# Patient Record
Sex: Male | Born: 1950
Health system: Southern US, Community
[De-identification: ages and names within clinical notes are randomized; demographics above are authoritative.]

## PROBLEM LIST (undated history)

## (undated) DIAGNOSIS — M1990 Unspecified osteoarthritis, unspecified site: Secondary | ICD-10-CM

## (undated) DIAGNOSIS — S3981XA Other specified injuries of abdomen, initial encounter: Secondary | ICD-10-CM

## (undated) DIAGNOSIS — M069 Rheumatoid arthritis, unspecified: Secondary | ICD-10-CM

## (undated) HISTORY — PX: SHOULDER SURGERY: SHX246

## (undated) HISTORY — DX: Other specified injuries of abdomen, initial encounter: S39.81XA

## (undated) HISTORY — DX: Rheumatoid arthritis, unspecified: M06.9

---

## 2003-11-08 ENCOUNTER — Encounter (INDEPENDENT_AMBULATORY_CARE_PROVIDER_SITE_OTHER): Payer: Self-pay | Admitting: Specialist

## 2003-11-08 ENCOUNTER — Ambulatory Visit (HOSPITAL_COMMUNITY): Admission: RE | Admit: 2003-11-08 | Discharge: 2003-11-08 | Payer: Self-pay | Admitting: Gastroenterology

## 2004-08-05 DIAGNOSIS — M8660 Other chronic osteomyelitis, unspecified site: Secondary | ICD-10-CM | POA: Insufficient documentation

## 2004-09-09 ENCOUNTER — Ambulatory Visit: Payer: Self-pay | Admitting: Infectious Diseases

## 2004-10-15 ENCOUNTER — Ambulatory Visit: Payer: Self-pay | Admitting: Infectious Diseases

## 2004-11-10 ENCOUNTER — Ambulatory Visit: Payer: Self-pay | Admitting: Infectious Diseases

## 2004-12-22 ENCOUNTER — Ambulatory Visit: Payer: Self-pay | Admitting: Infectious Diseases

## 2005-03-16 ENCOUNTER — Ambulatory Visit: Payer: Self-pay | Admitting: Infectious Diseases

## 2005-05-06 ENCOUNTER — Ambulatory Visit: Payer: Self-pay | Admitting: Infectious Diseases

## 2005-06-15 ENCOUNTER — Ambulatory Visit: Payer: Self-pay | Admitting: Infectious Diseases

## 2005-08-05 ENCOUNTER — Ambulatory Visit: Payer: Self-pay | Admitting: Infectious Diseases

## 2005-11-05 ENCOUNTER — Ambulatory Visit: Payer: Self-pay | Admitting: Infectious Diseases

## 2006-02-08 ENCOUNTER — Ambulatory Visit: Payer: Self-pay | Admitting: Infectious Diseases

## 2006-05-03 ENCOUNTER — Ambulatory Visit: Payer: Self-pay | Admitting: Infectious Diseases

## 2006-09-22 ENCOUNTER — Ambulatory Visit: Payer: Self-pay | Admitting: Infectious Diseases

## 2006-10-04 ENCOUNTER — Ambulatory Visit (HOSPITAL_COMMUNITY): Admission: RE | Admit: 2006-10-04 | Discharge: 2006-10-04 | Payer: Self-pay | Admitting: Infectious Diseases

## 2007-02-08 ENCOUNTER — Encounter (INDEPENDENT_AMBULATORY_CARE_PROVIDER_SITE_OTHER): Payer: Self-pay | Admitting: Family Medicine

## 2009-05-08 ENCOUNTER — Encounter: Admission: RE | Admit: 2009-05-08 | Discharge: 2009-05-08 | Payer: Self-pay | Admitting: Orthopedic Surgery

## 2010-11-23 ENCOUNTER — Encounter: Payer: Self-pay | Admitting: Infectious Diseases

## 2011-03-20 NOTE — Op Note (Signed)
NAME:  Leroy Hines, Leroy Hines                      ACCOUNT NO.:  [id number]   MEDICAL RECORD NO.:  [id number]                   PATIENT TYPE:  AMB   LOCATION:  ENDO                                 FACILITY:  [facility]   PHYSICIAN:  Danise Edge, M.D.                DATE OF BIRTH:  04-01-1951   DATE OF PROCEDURE:  11/07/2002  DATE OF DISCHARGE:                                 OPERATIVE REPORT   PROCEDURE:  Colonoscopy with rectal polypectomy   REFERRING PHYSICIAN:  Thora Lance, M.D.   INDICATIONS:  Leroy Hines is a 60 year old male, born December 03, 1950.  Leroy Hines is scheduled to undergo his first screening colonoscopy  with polypectomy to prevent colon cancer.   ENDOSCOPIST:  Danise Edge, M.D.   PREMEDICATION:  Versed 7 mg, Demerol 50 mg.   DESCRIPTION OF PROCEDURE:  After obtaining informed consent, Leroy Hines  was placed in the left lateral decubitus position.  I administered  intravenous Demerol and intravenous Versed to achieve conscious sedation for  the procedure.  The patient's blood pressure, oxygen saturation and cardiac  rhythm were monitored throughout the procedure and documented in the medical  record.   Anal inspection was normal.  Digital rectal exam was normal.  The Olympus  adjustable pediatric colonoscope was introduced into the rectum and advanced  to the cecum.  Colonic preparation for the exam today was excellent.  Rectum:  From the mid rectum a 2 mm sessile polyp was removed with  electrocautery snare and submitted for pathology interpretation.  Sigmoid colon and descending colon:  Normal.  Splenic flexure:  Normal.  Transverse colon:  Normal.  Hepatic flexure:  Normal.  Ascending colon:  Normal.  Cecum and ileocecal valve:  Normal.   ASSESSMENT:  A small polyp was removed from the mid rectum and submitted for  pathologic interpretation; otherwise normal screening proctocolonoscopy.   RECOMMENDATIONS:  If rectal polyp returns  neoplastic pathologically, Mr.  Hines should undergo repeat colonoscopy in five years.                                               Danise Edge, M.D.    MJ/MEDQ  D:  11/08/2003  T:  11/08/2003  Job:  [id number]   cc:   Thora Lance, M.D.  [address]  [place]  [place] [address]  Fax: [phone]

## 2015-03-07 ENCOUNTER — Other Ambulatory Visit: Payer: Self-pay | Admitting: Internal Medicine

## 2015-03-07 ENCOUNTER — Ambulatory Visit
Admission: RE | Admit: 2015-03-07 | Discharge: 2015-03-07 | Disposition: A | Discharge status: Home / Self Care | Payer: BLUE CROSS/BLUE SHIELD | Source: Ambulatory Visit | Attending: Internal Medicine | Admitting: Internal Medicine

## 2015-03-07 DIAGNOSIS — R0789 Other chest pain: Secondary | ICD-10-CM

## 2015-03-26 ENCOUNTER — Other Ambulatory Visit (HOSPITAL_COMMUNITY): Payer: Self-pay | Admitting: Internal Medicine

## 2015-03-26 DIAGNOSIS — R0789 Other chest pain: Secondary | ICD-10-CM

## 2015-03-29 ENCOUNTER — Ambulatory Visit (HOSPITAL_COMMUNITY)
Admission: RE | Admit: 2015-03-29 | Discharge: 2015-03-29 | Disposition: A | Discharge status: Home / Self Care | Payer: BLUE CROSS/BLUE SHIELD | Source: Ambulatory Visit | Attending: Internal Medicine | Admitting: Internal Medicine

## 2015-03-29 DIAGNOSIS — M949 Disorder of cartilage, unspecified: Secondary | ICD-10-CM | POA: Diagnosis not present

## 2015-03-29 DIAGNOSIS — R0789 Other chest pain: Secondary | ICD-10-CM

## 2015-04-04 ENCOUNTER — Emergency Department (HOSPITAL_COMMUNITY)
Admission: EM | Admit: 2015-04-04 | Discharge: 2015-04-04 | Disposition: A | Discharge status: Home / Self Care | Payer: BLUE CROSS/BLUE SHIELD | Attending: Emergency Medicine | Admitting: Emergency Medicine

## 2015-04-04 ENCOUNTER — Encounter (HOSPITAL_COMMUNITY): Payer: Self-pay | Admitting: Emergency Medicine

## 2015-04-04 ENCOUNTER — Emergency Department (HOSPITAL_COMMUNITY): Payer: BLUE CROSS/BLUE SHIELD

## 2015-04-04 DIAGNOSIS — R531 Weakness: Secondary | ICD-10-CM | POA: Diagnosis not present

## 2015-04-04 DIAGNOSIS — Z7982 Long term (current) use of aspirin: Secondary | ICD-10-CM | POA: Insufficient documentation

## 2015-04-04 DIAGNOSIS — R0789 Other chest pain: Secondary | ICD-10-CM | POA: Insufficient documentation

## 2015-04-04 DIAGNOSIS — Z79899 Other long term (current) drug therapy: Secondary | ICD-10-CM | POA: Diagnosis not present

## 2015-04-04 DIAGNOSIS — R079 Chest pain, unspecified: Secondary | ICD-10-CM | POA: Diagnosis present

## 2015-04-04 DIAGNOSIS — M1990 Unspecified osteoarthritis, unspecified site: Secondary | ICD-10-CM | POA: Diagnosis not present

## 2015-04-04 HISTORY — DX: Unspecified osteoarthritis, unspecified site: M19.90

## 2015-04-04 LAB — CBC WITH DIFFERENTIAL/PLATELET
Basophils Absolute: 0.1 K/uL (ref 0.0–0.1)
Basophils Relative: 1 % (ref 0–1)
Eosinophils Absolute: 0.2 K/uL (ref 0.0–0.7)
Eosinophils Relative: 5 % (ref 0–5)
HCT: 44.4 % (ref 39.0–52.0)
Hemoglobin: 15.1 g/dL (ref 13.0–17.0)
Lymphocytes Relative: 27 % (ref 12–46)
Lymphs Abs: 1.3 K/uL (ref 0.7–4.0)
MCH: 31.9 pg (ref 26.0–34.0)
MCHC: 34 g/dL (ref 30.0–36.0)
MCV: 93.7 fL (ref 78.0–100.0)
Monocytes Absolute: 0.3 K/uL (ref 0.1–1.0)
Monocytes Relative: 6 % (ref 3–12)
Neutro Abs: 2.9 K/uL (ref 1.7–7.7)
Neutrophils Relative %: 61 % (ref 43–77)
Platelets: 153 K/uL (ref 150–400)
RBC: 4.74 MIL/uL (ref 4.22–5.81)
RDW: 13.1 % (ref 11.5–15.5)
WBC: 4.8 K/uL (ref 4.0–10.5)

## 2015-04-04 LAB — BASIC METABOLIC PANEL
Anion gap: 8 (ref 5–15)
BUN: 10 mg/dL (ref 6–20)
CO2: 30 mmol/L (ref 22–32)
CREATININE: 0.98 mg/dL (ref 0.61–1.24)
Calcium: 9.5 mg/dL (ref 8.9–10.3)
Chloride: 102 mmol/L (ref 101–111)
GFR calc Af Amer: >60 mL/min (ref >60)
GFR calc non Af Amer: >60 mL/min (ref >60)
Glucose, Bld: 102 mg/dL — ABNORMAL HIGH (ref 65–99)
Potassium: 4 mmol/L (ref 3.5–5.1)
SODIUM: 140 mmol/L (ref 135–145)

## 2015-04-04 LAB — TROPONIN I: Troponin I: <0.03 ng/mL

## 2015-04-04 NOTE — Discharge Instructions (Signed)

## 2015-04-04 NOTE — ED Notes (Signed)
Tightness to central chest, with dizziness and arm feeling weak.  Says it is not pain.

## 2015-04-04 NOTE — ED Provider Notes (Signed)
CSN: [id number]     Arrival date & time 04/04/15  9562 History   First MD Initiated Contact with Patient 04/04/15 1000     Chief Complaint  Patient presents with  . Chest Pain     (Consider location/radiation/quality/duration/timing/severity/associated sxs/prior Treatment) HPI   Leroy Hines is a 64 y.o. male who presents to the Emergency Department complaining of vague onset of "fluttering sensation" to his left chest and extremity weakness 3 days ago symptoms persisted the following day, he contacted his PMD and had an office visit yesterday.  Patient state he felt "fine" yesterday and had a "normal" EKG in office yesterday. Today, he states he went to play golf and felt weakness in his arms and legs again and decided to come in for evaluation.  He denies pain, shortness of breath, nausea, vomiting or back pain.  He has taken one 81 mg ASA this morning.  He denies previous cardiac history, smoking or significant family hx.     Past Medical History  Diagnosis Date  . Arthritis    Past Surgical History  Procedure Laterality Date  . Shoulder surgery      right and left.   History reviewed. No pertinent family history. History  Substance Use Topics  . Smoking status: Never Smoker   . Smokeless tobacco: Not on file  . Alcohol Use: 14.4 oz/week    24 Cans of beer per week    Review of Systems  Constitutional: Negative for fever, activity change and appetite change.  Eyes: Negative for visual disturbance.  Respiratory: Positive for chest tightness. Negative for shortness of breath and wheezing.   Gastrointestinal: Negative for nausea, vomiting and abdominal pain.  Genitourinary: Negative for dysuria and flank pain.  Musculoskeletal: Negative for arthralgias.  Skin: Negative for color change and rash.  Neurological: Positive for weakness. Negative for dizziness, seizures, syncope, speech difficulty, light-headedness, numbness and headaches.  Psychiatric/Behavioral: Negative  for confusion.  All other systems reviewed and are negative.     Allergies  Sulfamethoxazole-trimethoprim  Home Medications   Prior to Admission medications   Medication Sig Start Date End Date Taking? Authorizing Provider  aspirin EC 81 MG tablet Take 81 mg by mouth daily.   Yes Historical Provider, MD  multivitamin-iron-minerals-folic acid (CENTRUM) chewable tablet Chew 1 tablet by mouth daily.   Yes Historical Provider, MD  Omega-3 Fatty Acids (FISH OIL PO) Take 1-2 capsules by mouth 2 (two) times daily. Take 1 capsule in the morning and 2 capsules at [place]   Yes Historical Provider, MD  vitamin C (ASCORBIC ACID) 500 MG tablet Take 500 mg by mouth daily.   Yes Historical Provider, MD  Vitamin D, Cholecalciferol, 1000 UNITS TABS Take 1,000 mg by mouth daily.   Yes Historical Provider, MD  HUMIRA PEN 40 MG/0.8ML PNKT Inject 40 mg into the skin every 14 (fourteen) days.  03/29/15   Historical Provider, MD   BP 157/96 mmHg  Pulse 70  Temp(Src) 98.1 F (36.7 C) (Oral)  Resp 12  Ht 5\' 10"  (1.778 m)  Wt 200 lb (90.719 kg)  BMI 28.70 kg/m2  SpO2 100% Physical Exam  Constitutional: He is oriented to person, place, and time. He appears well-developed and well-nourished. No distress.  HENT:  Head: Normocephalic and atraumatic.  Mouth/Throat: Oropharynx is clear and moist.  Neck: Normal range of motion. Neck supple.  Cardiovascular: Normal rate, regular rhythm, normal heart sounds and intact distal pulses.   No murmur heard. Pulmonary/Chest: Effort normal and breath sounds  normal. No respiratory distress. He exhibits no tenderness.  Abdominal: Soft. He exhibits no distension. There is no tenderness. There is no rebound and no guarding.  Musculoskeletal: Normal range of motion.  Neurological: He is alert and oriented to person, place, and time. He has normal strength. No sensory deficit. Coordination normal.  Skin: Skin is warm and dry.  Psychiatric: He has a normal mood and affect.  Thought content normal.  Nursing note and vitals reviewed.   ED Course  Procedures (including critical care time) Labs Review Labs Reviewed  BASIC METABOLIC PANEL - Abnormal; Notable for the following:    Glucose, Bld 102 (*)    All other components within normal limits  CBC WITH DIFFERENTIAL/PLATELET  TROPONIN I    Imaging Review Dg Chest 2 View  04/04/2015   CLINICAL DATA:  64 year old male with a history of chest tightness  EXAM: CHEST - 2 VIEW  COMPARISON:  CT 03/29/2015.  , plain film 03/07/2015, 10/02/2015  FINDINGS: Cardiomediastinal silhouette projects within normal limits in size and contour. No confluent airspace disease, pneumothorax, or pleural effusion.  No displaced fracture.  Unremarkable appearance of the upper abdomen.  IMPRESSION: No radiographic evidence of acute cardiopulmonary disease.  Signed,  Dulcy Fanny. Earleen Newport, DO  [facility]  [facility]   Electronically Signed   By: Corrie Mckusick D.O.   On: 04/04/2015 11:13   Ct Chest Wo Contrast  03/29/2015   CLINICAL DATA:  Pain at xiphoid when bending forward  EXAM: CT CHEST WITHOUT CONTRAST  TECHNIQUE: Multidetector CT imaging of the chest was performed following the standard protocol without IV contrast.  COMPARISON:  None.  FINDINGS: Mediastinum: The heart size appears normal. There is no pericardial effusion. Aortic atherosclerosis identified. There also calcifications within the LAD and RCA Coronary artery. The trachea is patent and appears midline. Normal appearance of the esophagus.  Lungs/Pleura: There is no pleural effusion. No airspace consolidation or atelectasis. No suspicious nodule or mass noted.  Upper Abdomen: The visualized portions of the liver and spleen are normal. The adrenal glands are unremarkable. Normal appearance of the pancreas.  Musculoskeletal: Review of the visualized bony structures is negative for aggressive lytic or sclerotic bone lesion. The manubrium,  sternal body and xiphoid process appear normal. There is no fracture identified. The thoracic spine appears normal. The remainder of the bony thorax is intact. No suspicious soft tissue mass associated with the sternum or xiphoid process.  IMPRESSION: 1. No active cardiopulmonary abnormalities. 2. No explanation for patient's the xiphoid pain. 3. Aortic atherosclerosis and coronary artery calcifications.   Electronically Signed   By: Kerby Moors M.D.   On: 03/29/2015 14:35      EKG Interpretation   Date/Time:  Thursday April 04 2015 09:52:30 EDT Ventricular Rate:  72 PR Interval:  178 QRS Duration: 107 QT Interval:  397 QTC Calculation: 434 R Axis:   70 Text Interpretation:  Sinus rhythm RSR' in V1 or V2, right VCD or RVH  Confirmed by PICKERING  MD, NATHAN [phone]) on 04/04/2015 10:22:06 AM      MDM   Final diagnoses:  Chest pain, unspecified chest pain type   Labs and XR are reassuring.  Pt has been resting comfortably during ed stay.    Pt also seen by Dr. Alvino Chapel and felt patient is stable for discharge.  Will give referral for cards and pt agrees to return here for any worsening sx's.     Kem Parkinson, PA-C 04/06/15 2217  Davonna Belling, MD 04/08/15 [phone]

## 2015-04-22 ENCOUNTER — Ambulatory Visit (INDEPENDENT_AMBULATORY_CARE_PROVIDER_SITE_OTHER): Payer: BLUE CROSS/BLUE SHIELD | Admitting: Internal Medicine

## 2015-04-22 ENCOUNTER — Encounter: Payer: Self-pay | Admitting: Internal Medicine

## 2015-04-22 VITALS — BP 138/80 | HR 72 | Ht 70.0 in | Wt 202.0 lb

## 2015-04-22 DIAGNOSIS — I4901 Ventricular fibrillation: Secondary | ICD-10-CM

## 2015-04-22 DIAGNOSIS — I498 Other specified cardiac arrhythmias: Secondary | ICD-10-CM

## 2015-04-22 DIAGNOSIS — R0789 Other chest pain: Secondary | ICD-10-CM

## 2015-04-22 NOTE — Addendum Note (Signed)
Addended by: Fay Records on: 04/22/2015 10:07 PM   Modules accepted: Level of Service

## 2015-04-22 NOTE — Progress Notes (Signed)
Cardiology Office Note   Date:  04/22/2015   ID:  Leroy Hines, DOB [birth year], MRN [id number]  PCP:  Henrine Screws, MD  Cardiologist:   Dorris Carnes, MD   No chief complaint on file.  F/U from ER for chest discomfort    History of Present Illness: Leroy Hines is a 64 y.o. male who was seen in ER on 6/2  COmplained of fluttering sensation in L chest with some weakness in arm  Occurred at rest   Lasted about a day  Came back  Flet heavy in arms and legs   He was in SR at the time  Sent home with f/u in cardiology    Pt felt a fluttering then discomfort in chest  Pt had had a XT of chest done for pain in xiphoid process.  THis showed some atherosclerosis of LAD and RCA   Has seen by Dr Laurann Montana since ER visit   Since seen by Dr Laurann Montana has not had any more symptoms    Been active  Played golf  Mowed lawn.    Denies pressure  Denies fluttering    Current Outpatient Prescriptions  Medication Sig Dispense Refill  . aspirin EC 81 MG tablet Take 81 mg by mouth daily.    Marland Kitchen HUMIRA PEN 40 MG/0.8ML PNKT Inject 40 mg into the skin every 14 (fourteen) days.   4  . multivitamin-iron-minerals-folic acid (CENTRUM) chewable tablet Chew 1 tablet by mouth daily.    . Omega-3 Fatty Acids (FISH OIL PO) Take 1 capsule by mouth daily. Take 1 capsule in the morning and 2 capsules at [place]    . vitamin C (ASCORBIC ACID) 500 MG tablet Take 500 mg by mouth daily.    . Vitamin D, Cholecalciferol, 1000 UNITS TABS Take 1,000 mg by mouth daily.    Marland Kitchen VIAGRA 100 MG tablet   2   No current facility-administered medications for this visit.    Allergies:   Sulfamethoxazole-trimethoprim   Past Medical History  Diagnosis Date  . Arthritis     Past Surgical History  Procedure Laterality Date  . Shoulder surgery      right and left.     Social History:  The patient  reports that he has never smoked. He does not have any smokeless tobacco history on file. He reports that he drinks  about 14.4 oz of alcohol per week. He reports that he does not use illicit drugs.   Family History:  The patient's family history is not on file.    ROS:  Please see the history of present illness. All other systems are reviewed and  Negative to the above problem except as noted.    PHYSICAL EXAM: VS:  BP 138/80 mmHg  Pulse 72  Ht 5\' 10"  (1.778 m)  Wt 202 lb (91.627 kg)  BMI 28.98 kg/m2  SpO2 98%  GEN: Well nourished, well developed, in no acute distress HEENT: normal Neck: no JVD, carotid bruits, or masses Cardiac: RRR; no murmurs, rubs, or gallops,no edema  Chest  Tender in xiphid region   Respiratory:  clear to auscultation bilaterally, normal work of breathing GI: soft, nontender, nondistended, + BS  No hepatomegaly  MS: no deformity Moving all extremities   Skin: warm and dry, no rash Neuro:  Strength and sensation are intact Psych: euthymic mood, full affect   EKG:  EKG is not ordered today.   Lipid Panel No results found for: CHOL, TRIG, HDL, CHOLHDL, VLDL, LDLCALC, LDLDIRECT  Wt Readings from Last 3 Encounters:  04/22/15 202 lb (91.627 kg)  04/04/15 200 lb (90.719 kg)      ASSESSMENT AND PLAN: 1  Chest pressure  FLuttering   Atypical for ischemia  With fluttering ? afib but I am not convinced. He is active no  No recurrence would follow  CT of chest for xiphoid pain showed coronary calcificaitons consistent with atherosclerosis . I reivewed findings with pt  Has CAD but again I do not think flow limiting   I would recomm ASA as he is on  I would also recomm a statin  I do not have labs but have requested them  [place] with pharmacy interactions with [place]  Pt appears very reluctant to start Rx  Would like to discuss with Dr Inda Merlin as well.  2.  Fluttering  Showed him how to check carotid pulse  Call if HR high.  I would not sched for monitor   3.  Xiphoid pain  Probably musculoskel in origin  Discussed wt loss  4  HL  Pt told his lipids are  high  WIll get from Dr Inda Merlin      Signed, Dorris Carnes, MD  04/22/2015 10:06 AM    [address], [place], [place]  [address] Phone: [phone]; Fax: [phone]

## 2015-05-22 ENCOUNTER — Telehealth: Payer: Self-pay

## 2015-05-22 NOTE — Telephone Encounter (Signed)
Patient wants to see his PCP first before starting Crestor. He will get back in touch with our office when he decides if he wants to start Crestor.   Review of lipids with S Earl    Recomm strating crestor 10 mg F/u lipid panel in 8 wks with AST    ----- Message -----     From: Aris Georgia, [facility]     Sent: 05/17/2015  3:20 PM      To: Fay Records, MD        No issues of statin with Humira. LDL was 115 so could start at a lower dose. Given his hesitancy, may start with Crestor 10mg  daily to limit potential SE.         ----- Message -----     From: Fay Records, MD     Sent: 04/23/2015  4:02 PM      To: Aris Georgia, RPH        Pt's LDL is 115, HDL 53 Trig 162     Very anxious about starting statin given that he is on Humira.    Thoughts about statins, preferences    Has CAD on CT

## 2015-05-27 ENCOUNTER — Telehealth: Payer: Self-pay | Admitting: Internal Medicine

## 2015-05-27 NOTE — Telephone Encounter (Signed)
New message    Patient calling back to discuss with the  nurse on new medication crestor

## 2015-05-27 NOTE — Telephone Encounter (Signed)
Patient is still wanting to talk with his PCP before he starts Crestor. Patient wanting copies of lab work. Patient's MyChart pending, so patient given number to reset password. Patient verbalized understanding and will keep office informed of his decision.

## 2015-07-15 ENCOUNTER — Ambulatory Visit (INDEPENDENT_AMBULATORY_CARE_PROVIDER_SITE_OTHER): Payer: BLUE CROSS/BLUE SHIELD | Admitting: Physician Assistant

## 2015-07-15 ENCOUNTER — Encounter: Payer: Self-pay | Admitting: Physician Assistant

## 2015-07-15 VITALS — BP 110/70 | HR 66 | Wt 193.2 lb

## 2015-07-15 DIAGNOSIS — R002 Palpitations: Secondary | ICD-10-CM

## 2015-07-15 DIAGNOSIS — R079 Chest pain, unspecified: Secondary | ICD-10-CM | POA: Diagnosis not present

## 2015-07-15 DIAGNOSIS — E785 Hyperlipidemia, unspecified: Secondary | ICD-10-CM

## 2015-07-15 NOTE — Progress Notes (Signed)
Cardiology Office Note   Date:  07/15/2015   ID:  Leroy Hines, DOB May 16, 1951, MRN [id number]  PCP:  Leroy Screws, MD  Cardiologist:  Dr. Harrington Challenger  Chief Complaint: Fluttering    History of Present Illness: Leroy Hines is a 64 y.o. male who presents for follow up. She saw Dr. Harrington Challenger 04/22/15 for some chest pain and fluttering. CT scan of the chest from xiphoid pain showed coronary calcification consistent with atherosclerosis. She recommended aspirin and a statin. The patient was reluctant to take a statin because of everything he is heard and read. He has really adjusted his diet and got his cholesterol down to 182 LDL 120 and HDL is 45. He had some fluttering in his chest on Saturday but never checked his pulse because he couldn't remember how to. He still does not want to take a statin. He is requesting a stress test and dietitian.  He plays golf 3-4 days a week and does a lot of walking. He denies any chest tightness, pressure, dyspnea, dyspnea on exertion, dizziness or presyncope. He has occasional fluttering that doesn't last long.    Past Medical History  Diagnosis Date  . Arthritis     Past Surgical History  Procedure Laterality Date  . Shoulder surgery      right and left.     Current Outpatient Prescriptions  Medication Sig Dispense Refill  . aspirin EC 81 MG tablet Take 81 mg by mouth daily.    Marland Kitchen HUMIRA PEN 40 MG/0.8ML PNKT Inject 40 mg into the skin every 14 (fourteen) days.   4  . multivitamin-iron-minerals-folic acid (CENTRUM) chewable tablet Chew 1 tablet by mouth daily.    . Omega-3 Fatty Acids (FISH OIL PO) Take 1 capsule by mouth daily. Take 1 capsule in the morning and 2 capsules at [place]    . VIAGRA 100 MG tablet   2  . vitamin C (ASCORBIC ACID) 500 MG tablet Take 500 mg by mouth daily.    . Vitamin D, Cholecalciferol, 1000 UNITS TABS Take 1,000 mg by mouth daily.     No current facility-administered medications for this visit.    Allergies:    Sulfamethoxazole-trimethoprim    Social History:  The patient  reports that he has never smoked. He does not have any smokeless tobacco history on file. He reports that he drinks about 14.4 oz of alcohol per week. He reports that he does not use illicit drugs.   Family History:  The patient's    family history includes Cancer in his father and mother; Healthy in his brother; Heart disease in his mother.    ROS:  Please see the history of present illness.   Otherwise, review of systems are positive for recent cough treated with Z-Pak. Also anxiety..   All other systems are reviewed and negative.    PHYSICAL EXAM: VS:  BP 110/70 mmHg  Pulse 66  Wt 193 lb 4 oz (87.658 kg) , BMI Body mass index is 27.73 kg/(m^2). GEN: Well nourished, well developed, in no acute distress Neck: no JVD, HJR, carotid bruits, or masses Cardiac: RRR; no murmurs,gallop, rubs, thrill or heave,  Respiratory:  clear to auscultation bilaterally, normal work of breathing GI: soft, nontender, nondistended, + BS MS: no deformity or atrophy Extremities: without cyanosis, clubbing, edema, good distal pulses bilaterally.  Skin: warm and dry, no rash Neuro:  Strength and sensation are intact    EKG:  EKG is not ordered today.    Recent Labs:  04/04/2015: BUN 10; Creatinine, Ser 0.98; Hemoglobin 15.1; Platelets 153; Potassium 4.0; Sodium 140    Lipid Panel No results found for: CHOL, TRIG, HDL, CHOLHDL, VLDL, LDLCALC, LDLDIRECT    Wt Readings from Last 3 Encounters:  07/15/15 193 lb 4 oz (87.658 kg)  04/22/15 202 lb (91.627 kg)  04/04/15 200 lb (90.719 kg)      Other studies Reviewed: Additional studies/ records that were reviewed today include and review of the records demonstrates:   IMPRESSION: 1. No active cardiopulmonary abnormalities. 2. No explanation for patient's the xiphoid pain. 3. Aortic atherosclerosis and coronary artery calcifications.     Electronically Signed   By: Kerby Moors  M.D.   On: 03/29/2015 14:35    ASSESSMENT AND PLAN:  Chest pain Patient had an episode of chest pain that took him to the ER back in June. He has had no recurrence. He did have coronary calcification on CT scan. He is requesting a stress test. We'll order exercise Myoview and follow-up with Dr. Harrington Challenger.  Hyperlipidemia Patient has really adjusted his diet and lowered his cholesterol. He still would like to see a dietitian. Will refer.  Palpitations Palpitations are rare and not limiting.    Sumner Boast, PA-C  07/15/2015 12:54 PM    [facility] [address] [place], [place], [place]  [address] Phone: [phone]; Fax: [phone]

## 2015-07-15 NOTE — Assessment & Plan Note (Signed)
Patient had an episode of chest pain that took him to the ER back in June. He has had no recurrence. He did have coronary calcification on CT scan. He is requesting a stress test. We'll order exercise Myoview and follow-up with Dr. Harrington Challenger.

## 2015-07-15 NOTE — Assessment & Plan Note (Signed)
Patient has really adjusted his diet and lowered his cholesterol. He still would like to see a dietitian. Will refer.

## 2015-07-15 NOTE — Assessment & Plan Note (Signed)
Palpitations are rare and not limiting.

## 2015-07-15 NOTE — Patient Instructions (Addendum)
Medication Instructions:  Your physician recommends that you continue on your current medications as directed. Please refer to the Current Medication list given to you today.   Labwork: None ordered  Testing/Procedures: None ordered  Follow-Up: Your physician recommends that you schedule a follow-up appointment in 2-3 [place] DR. ROSS  Special Instructions: 1.  You have been referred to a Dietician.  You will be contacted from that office regarding an appointment.   2.  Your physician has requested that you have en exercise stress myoview. For further information please visit [email or web address]. Please follow instruction sheet, as given.

## 2015-07-16 ENCOUNTER — Telehealth (HOSPITAL_COMMUNITY): Payer: Self-pay

## 2015-07-16 NOTE — Telephone Encounter (Signed)
Patient given detailed instructions per Myocardial Perfusion Study Information Sheet for test on 07-18-2015 at 0900. Patient notified to arrive 15 minutes early and that it is imperative to arrive on time for appointment to keep from having the test rescheduled.  If you need to cancel or reschedule your appointment, please call the office within 24 hours of your appointment. Failure to do so may result in a cancellation of your appointment, and a $50 no show fee. Patient verbalized understanding. Oletta Lamas, Ginnette Gates A

## 2015-07-18 ENCOUNTER — Ambulatory Visit (HOSPITAL_COMMUNITY): Payer: BLUE CROSS/BLUE SHIELD | Attending: Cardiology

## 2015-07-18 DIAGNOSIS — R079 Chest pain, unspecified: Secondary | ICD-10-CM | POA: Insufficient documentation

## 2015-07-18 DIAGNOSIS — E785 Hyperlipidemia, unspecified: Secondary | ICD-10-CM | POA: Diagnosis not present

## 2015-07-18 DIAGNOSIS — R002 Palpitations: Secondary | ICD-10-CM | POA: Insufficient documentation

## 2015-07-18 LAB — MYOCARDIAL PERFUSION IMAGING
CHL CUP MPHR: 156 {beats}/min
CHL CUP NUCLEAR SSS: 3
CSEPEW: 10.1 METS
CSEPHR: 87 %
CSEPPHR: 136 {beats}/min
Exercise duration (min): 8 min
Exercise duration (sec): 45 s
LHR: 0.33
LVDIAVOL: 98 mL
LVSYSVOL: 33 mL
RPE: 19
Rest HR: 60 {beats}/min
SDS: 2
SRS: 1
TID: 0.8

## 2015-07-18 MED ORDER — TECHNETIUM TC 99M SESTAMIBI GENERIC - CARDIOLITE
33.0000 | Freq: Once | INTRAVENOUS | Status: AC | PRN
  Administered 2015-07-18: 33 via INTRAVENOUS

## 2015-07-18 MED ORDER — TECHNETIUM TC 99M SESTAMIBI GENERIC - CARDIOLITE
10.1000 | Freq: Once | INTRAVENOUS | Status: AC | PRN
  Administered 2015-07-18: 10.1 via INTRAVENOUS

## 2015-07-22 ENCOUNTER — Telehealth: Payer: Self-pay | Admitting: Internal Medicine

## 2015-07-22 NOTE — Telephone Encounter (Signed)
Patient notified of detailed Stress Myoview study results and notations - "Stress myoview normal", per Amie Portland, PA.  Patient verbalized understanding and appreciation for phone call. Denies further questions or concerns.

## 2015-07-22 NOTE — Telephone Encounter (Signed)
Follow up  ° ° °Patient returning call back to nurse  °

## 2015-09-02 ENCOUNTER — Other Ambulatory Visit (HOSPITAL_COMMUNITY): Payer: Self-pay | Admitting: Internal Medicine

## 2015-09-02 DIAGNOSIS — K219 Gastro-esophageal reflux disease without esophagitis: Secondary | ICD-10-CM

## 2015-09-02 DIAGNOSIS — IMO0001 Reserved for inherently not codable concepts without codable children: Secondary | ICD-10-CM

## 2015-09-02 DIAGNOSIS — R0789 Other chest pain: Secondary | ICD-10-CM

## 2015-09-09 ENCOUNTER — Encounter (HOSPITAL_COMMUNITY)
Admission: RE | Admit: 2015-09-09 | Discharge: 2015-09-09 | Disposition: A | Discharge status: Home / Self Care | Payer: BLUE CROSS/BLUE SHIELD | Source: Ambulatory Visit | Attending: Internal Medicine | Admitting: Internal Medicine

## 2015-09-09 DIAGNOSIS — IMO0001 Reserved for inherently not codable concepts without codable children: Secondary | ICD-10-CM

## 2015-09-09 DIAGNOSIS — K219 Gastro-esophageal reflux disease without esophagitis: Secondary | ICD-10-CM | POA: Insufficient documentation

## 2015-09-09 DIAGNOSIS — R0789 Other chest pain: Secondary | ICD-10-CM | POA: Insufficient documentation

## 2015-09-09 MED ORDER — TECHNETIUM TC 99M MEBROFENIN IV KIT
5.1800 | PACK | Freq: Once | INTRAVENOUS | Status: DC | PRN
  Administered 2015-09-09: 5 via INTRAVENOUS
  Filled 2015-09-09: qty 6

## 2015-09-09 MED ORDER — SINCALIDE 5 MCG IJ SOLR
0.0200 ug/kg | Freq: Once | INTRAMUSCULAR | Status: AC
  Administered 2015-09-09: 4.27 ug via INTRAVENOUS

## 2015-09-09 MED ORDER — SINCALIDE 5 MCG IJ SOLR
INTRAMUSCULAR | Status: AC
  Administered 2015-09-09: 4.27 ug via INTRAVENOUS
  Filled 2015-09-09: qty 5

## 2015-09-30 ENCOUNTER — Other Ambulatory Visit: Payer: Self-pay | Admitting: Gastroenterology

## 2015-09-30 DIAGNOSIS — R079 Chest pain, unspecified: Secondary | ICD-10-CM

## 2015-10-04 ENCOUNTER — Other Ambulatory Visit: Payer: BLUE CROSS/BLUE SHIELD

## 2015-10-04 ENCOUNTER — Ambulatory Visit
Admission: RE | Admit: 2015-10-04 | Discharge: 2015-10-04 | Disposition: A | Discharge status: Home / Self Care | Payer: BLUE CROSS/BLUE SHIELD | Source: Ambulatory Visit | Attending: Gastroenterology | Admitting: Gastroenterology

## 2015-10-04 DIAGNOSIS — R079 Chest pain, unspecified: Secondary | ICD-10-CM

## 2015-10-14 ENCOUNTER — Ambulatory Visit: Payer: BLUE CROSS/BLUE SHIELD | Admitting: Internal Medicine

## 2015-10-23 ENCOUNTER — Encounter: Payer: Self-pay | Admitting: Internal Medicine

## 2015-10-23 ENCOUNTER — Ambulatory Visit (INDEPENDENT_AMBULATORY_CARE_PROVIDER_SITE_OTHER): Payer: BLUE CROSS/BLUE SHIELD | Admitting: Internal Medicine

## 2015-10-23 VITALS — BP 128/78 | HR 72 | Ht 70.0 in | Wt 190.8 lb

## 2015-10-23 DIAGNOSIS — E785 Hyperlipidemia, unspecified: Secondary | ICD-10-CM

## 2015-10-23 DIAGNOSIS — Z79899 Other long term (current) drug therapy: Secondary | ICD-10-CM | POA: Diagnosis not present

## 2015-10-23 LAB — LIPID PANEL
CHOL/HDL RATIO: 3.4 ratio (ref <=5.0)
CHOLESTEROL: 178 mg/dL (ref 125–200)
HDL: 53 mg/dL (ref >=40)
LDL Cholesterol: 102 mg/dL (ref <130)
TRIGLYCERIDES: 116 mg/dL (ref <150)
VLDL: 23 mg/dL (ref <30)

## 2015-10-23 NOTE — Progress Notes (Signed)
.    Cardiology Office Note   Date:  10/23/2015   ID:  Leroy Hines, DOB [birth year], MRN [id number]  PCP:  Henrine Screws, MD  Cardiologist:   Dorris Carnes, MD   F/U of CP     History of Present Illness: Leroy Hines is a 64 y.o. male with a history of chest pain  I saw him in June  He was seen by [name] in September.  CT showed coronary calcifications.s  Rx ASA and statin   Did not want to take statin  Requested a stress test  Also had palpitations     Since seen has occasional spells of chest discomfort  Mild  Not associated with activity Denies  Palpitations   Active Has become a vegetarian      Current Outpatient Prescriptions  Medication Sig Dispense Refill  . aspirin EC 81 MG tablet Take 81 mg by mouth daily.    . calcium acetate (PHOSLO) 667 MG capsule Take by mouth 3 (three) times daily with meals.    Marland Kitchen HUMIRA PEN 40 MG/0.8ML PNKT Inject 40 mg into the skin every 14 (fourteen) days.   4  . multivitamin-iron-minerals-folic acid (CENTRUM) chewable tablet Chew 1 tablet by mouth daily.    . Omega-3 Fatty Acids (FISH OIL PO) Take 1 capsule by mouth daily. Take 1 capsule in the morning and 2 capsules at Supper    . VIAGRA 100 MG tablet   2  . vitamin C (ASCORBIC ACID) 500 MG tablet Take 500 mg by mouth daily.    . Vitamin D, Cholecalciferol, 1000 UNITS TABS Take 1,000 mg by mouth daily.     No current facility-administered medications for this visit.    Allergies:   Sulfamethoxazole-trimethoprim   Past Medical History  Diagnosis Date  . Arthritis     Past Surgical History  Procedure Laterality Date  . Shoulder surgery      right and left.     Social History:  The patient  reports that he has never smoked. He does not have any smokeless tobacco history on file. He reports that he drinks about 14.4 oz of alcohol per week. He reports that he does not use illicit drugs.   Family History:  The patient's family history includes Cancer in his father and  mother; Healthy in his brother; Heart disease in his mother.    ROS:  Please see the history of present illness. All other systems are reviewed and  Negative to the above problem except as noted.    PHYSICAL EXAM: VS:  Pulse 72  Ht 5\' 10"  (1.778 m)  Wt 86.546 kg (190 lb 12.8 oz)  BMI 27.38 kg/m2  SpO2 93%  GEN: Well nourished, well developed, in no acute distress HEENT: normal Neck: no JVD, carotid bruits, or masses Cardiac: RRR; no murmurs, rubs, or gallops,no edema  Respiratory:  clear to auscultation bilaterally, normal work of breathing GI: soft, nontender, nondistended, + BS  No hepatomegaly  MS: no deformity Moving all extremities   Skin: warm and dry, no rash Neuro:  Strength and sensation are intact Psych: euthymic mood, full affect   EKG:  EKG is not  ordered today.   Lipid Panel No results found for: CHOL, TRIG, HDL, CHOLHDL, VLDL, LDLCALC, LDLDIRECT    Wt Readings from Last 3 Encounters:  10/23/15 86.546 kg (190 lb 12.8 oz)  07/18/15 87.544 kg (193 lb)  07/15/15 87.658 kg (193 lb 4 oz)  ASSESSMENT AND PLAN:  1  CP INtermittent  Atypical  I am not convinced angina  WIth calcifications on CT I would keep on asa  Check CBC   2.  Palpitations  Denies   3.  HL  Pt has become a vegetarian  WIll check again now that he has been on diet longer   4.   RA  Continue humira    F/U 1 year       Signed, Dorris Carnes, MD  10/23/2015 8:59 AM    [facility] [place], [place], [place]  [address] Phone: [phone]; Fax: [phone]

## 2015-10-23 NOTE — Patient Instructions (Signed)
Your physician wants you to follow-up in: 1 Year with Dr. Harrington Challenger.  You will receive a reminder letter in the mail two months in advance. If you don't receive a letter, please call our office to schedule the follow-up appointment.  Your physician recommends that you continue on your current medications as directed. Please refer to the Current Medication list given to you today.  Your physician recommends that you return for lab work in: Fasting  If you need a refill on your cardiac medications before your next appointment, please call your pharmacy.  Thank you for choosing [facility]!

## 2015-10-24 LAB — CBC WITH DIFFERENTIAL/PLATELET
BASOS PCT: 1 % (ref 0–1)
Basophils Absolute: 0 10*3/uL (ref 0.0–0.1)
EOS PCT: 8 % — AB (ref 0–5)
Eosinophils Absolute: 0.4 10*3/uL (ref 0.0–0.7)
HEMATOCRIT: 44.7 % (ref 39.0–52.0)
HEMOGLOBIN: 15.2 g/dL (ref 13.0–17.0)
Lymphocytes Relative: 35 % (ref 12–46)
Lymphs Abs: 1.6 10*3/uL (ref 0.7–4.0)
MCH: 32.1 pg (ref 26.0–34.0)
MCHC: 34 g/dL (ref 30.0–36.0)
MCV: 94.5 fL (ref 78.0–100.0)
MONO ABS: 0.3 10*3/uL (ref 0.1–1.0)
MONOS PCT: 7 % (ref 3–12)
MPV: 10.6 fL (ref 8.6–12.4)
NEUTROS ABS: 2.2 10*3/uL (ref 1.7–7.7)
Neutrophils Relative %: 49 % (ref 43–77)
PLATELETS: 154 10*3/uL (ref 150–400)
RBC: 4.73 MIL/uL (ref 4.22–5.81)
RDW: 14.2 % (ref 11.5–15.5)
WBC: 4.5 10*3/uL (ref 4.0–10.5)

## 2015-12-25 DIAGNOSIS — L821 Other seborrheic keratosis: Secondary | ICD-10-CM | POA: Diagnosis not present

## 2015-12-25 DIAGNOSIS — D1801 Hemangioma of skin and subcutaneous tissue: Secondary | ICD-10-CM | POA: Diagnosis not present

## 2015-12-25 DIAGNOSIS — L853 Xerosis cutis: Secondary | ICD-10-CM | POA: Diagnosis not present

## 2015-12-25 DIAGNOSIS — L570 Actinic keratosis: Secondary | ICD-10-CM | POA: Diagnosis not present

## 2015-12-25 DIAGNOSIS — Z85828 Personal history of other malignant neoplasm of skin: Secondary | ICD-10-CM | POA: Diagnosis not present

## 2016-01-07 DIAGNOSIS — M79642 Pain in left hand: Secondary | ICD-10-CM | POA: Diagnosis not present

## 2016-01-07 DIAGNOSIS — M79641 Pain in right hand: Secondary | ICD-10-CM | POA: Diagnosis not present

## 2016-01-07 DIAGNOSIS — M0609 Rheumatoid arthritis without rheumatoid factor, multiple sites: Secondary | ICD-10-CM | POA: Diagnosis not present

## 2016-01-07 DIAGNOSIS — Z79899 Other long term (current) drug therapy: Secondary | ICD-10-CM | POA: Diagnosis not present

## 2016-03-04 DIAGNOSIS — L309 Dermatitis, unspecified: Secondary | ICD-10-CM | POA: Diagnosis not present

## 2016-03-04 DIAGNOSIS — Z85828 Personal history of other malignant neoplasm of skin: Secondary | ICD-10-CM | POA: Diagnosis not present

## 2016-04-01 DIAGNOSIS — I2510 Atherosclerotic heart disease of native coronary artery without angina pectoris: Secondary | ICD-10-CM | POA: Diagnosis not present

## 2016-04-01 DIAGNOSIS — I700 Atherosclerosis of aorta: Secondary | ICD-10-CM | POA: Diagnosis not present

## 2016-04-01 DIAGNOSIS — Z23 Encounter for immunization: Secondary | ICD-10-CM | POA: Diagnosis not present

## 2016-04-01 DIAGNOSIS — Z125 Encounter for screening for malignant neoplasm of prostate: Secondary | ICD-10-CM | POA: Diagnosis not present

## 2016-04-01 DIAGNOSIS — E559 Vitamin D deficiency, unspecified: Secondary | ICD-10-CM | POA: Diagnosis not present

## 2016-04-01 DIAGNOSIS — Z79899 Other long term (current) drug therapy: Secondary | ICD-10-CM | POA: Diagnosis not present

## 2016-04-01 DIAGNOSIS — N529 Male erectile dysfunction, unspecified: Secondary | ICD-10-CM | POA: Diagnosis not present

## 2016-04-01 DIAGNOSIS — M545 Low back pain: Secondary | ICD-10-CM | POA: Diagnosis not present

## 2016-04-01 DIAGNOSIS — M1990 Unspecified osteoarthritis, unspecified site: Secondary | ICD-10-CM | POA: Diagnosis not present

## 2016-04-01 DIAGNOSIS — Z0001 Encounter for general adult medical examination with abnormal findings: Secondary | ICD-10-CM | POA: Diagnosis not present

## 2016-04-08 DIAGNOSIS — M0609 Rheumatoid arthritis without rheumatoid factor, multiple sites: Secondary | ICD-10-CM | POA: Diagnosis not present

## 2016-04-08 DIAGNOSIS — Z79899 Other long term (current) drug therapy: Secondary | ICD-10-CM | POA: Diagnosis not present

## 2016-04-14 DIAGNOSIS — K4090 Unilateral inguinal hernia, without obstruction or gangrene, not specified as recurrent: Secondary | ICD-10-CM | POA: Diagnosis not present

## 2016-04-16 DIAGNOSIS — K4090 Unilateral inguinal hernia, without obstruction or gangrene, not specified as recurrent: Secondary | ICD-10-CM | POA: Diagnosis not present

## 2016-05-18 DIAGNOSIS — H1132 Conjunctival hemorrhage, left eye: Secondary | ICD-10-CM | POA: Diagnosis not present

## 2016-05-25 DIAGNOSIS — M0609 Rheumatoid arthritis without rheumatoid factor, multiple sites: Secondary | ICD-10-CM | POA: Diagnosis not present

## 2016-05-25 DIAGNOSIS — Z79899 Other long term (current) drug therapy: Secondary | ICD-10-CM | POA: Diagnosis not present

## 2016-06-02 DIAGNOSIS — M0609 Rheumatoid arthritis without rheumatoid factor, multiple sites: Secondary | ICD-10-CM | POA: Diagnosis not present

## 2016-06-19 DIAGNOSIS — R51 Headache: Secondary | ICD-10-CM | POA: Diagnosis not present

## 2016-07-09 DIAGNOSIS — Z79899 Other long term (current) drug therapy: Secondary | ICD-10-CM | POA: Diagnosis not present

## 2016-07-09 DIAGNOSIS — M79641 Pain in right hand: Secondary | ICD-10-CM | POA: Diagnosis not present

## 2016-07-09 DIAGNOSIS — M0609 Rheumatoid arthritis without rheumatoid factor, multiple sites: Secondary | ICD-10-CM | POA: Diagnosis not present

## 2016-07-09 DIAGNOSIS — R51 Headache: Secondary | ICD-10-CM | POA: Diagnosis not present

## 2016-08-10 DIAGNOSIS — H5213 Myopia, bilateral: Secondary | ICD-10-CM | POA: Diagnosis not present

## 2016-08-10 DIAGNOSIS — H524 Presbyopia: Secondary | ICD-10-CM | POA: Diagnosis not present

## 2016-08-10 DIAGNOSIS — H52223 Regular astigmatism, bilateral: Secondary | ICD-10-CM | POA: Diagnosis not present

## 2016-08-10 DIAGNOSIS — H3563 Retinal hemorrhage, bilateral: Secondary | ICD-10-CM | POA: Diagnosis not present

## 2016-08-27 DIAGNOSIS — Z23 Encounter for immunization: Secondary | ICD-10-CM | POA: Diagnosis not present

## 2016-09-08 DIAGNOSIS — M0609 Rheumatoid arthritis without rheumatoid factor, multiple sites: Secondary | ICD-10-CM | POA: Diagnosis not present

## 2016-09-22 DIAGNOSIS — M0609 Rheumatoid arthritis without rheumatoid factor, multiple sites: Secondary | ICD-10-CM | POA: Diagnosis not present

## 2016-10-06 DIAGNOSIS — M0609 Rheumatoid arthritis without rheumatoid factor, multiple sites: Secondary | ICD-10-CM | POA: Diagnosis not present

## 2016-10-13 DIAGNOSIS — M0609 Rheumatoid arthritis without rheumatoid factor, multiple sites: Secondary | ICD-10-CM | POA: Diagnosis not present

## 2016-10-13 DIAGNOSIS — Z6828 Body mass index (BMI) 28.0-28.9, adult: Secondary | ICD-10-CM | POA: Diagnosis not present

## 2016-10-13 DIAGNOSIS — E663 Overweight: Secondary | ICD-10-CM | POA: Diagnosis not present

## 2016-10-13 DIAGNOSIS — Z79899 Other long term (current) drug therapy: Secondary | ICD-10-CM | POA: Diagnosis not present

## 2016-10-13 DIAGNOSIS — M79641 Pain in right hand: Secondary | ICD-10-CM | POA: Diagnosis not present

## 2016-10-22 ENCOUNTER — Ambulatory Visit
Admission: RE | Admit: 2016-10-22 | Discharge: 2016-10-22 | Disposition: A | Discharge status: Home / Self Care | Payer: Medicare Other | Source: Ambulatory Visit | Attending: Internal Medicine | Admitting: Internal Medicine

## 2016-10-22 ENCOUNTER — Other Ambulatory Visit: Payer: Self-pay | Admitting: Internal Medicine

## 2016-10-22 DIAGNOSIS — M79604 Pain in right leg: Secondary | ICD-10-CM

## 2016-10-29 NOTE — Progress Notes (Signed)
Cardiology Office Note   Date:  10/30/2016   ID:  Leroy Hines, DOB 1951/04/18, MRN [id number]  PCP:  Henrine Screws, MD  Cardiologist:   Dorris Carnes, MD    F/U of CP   History of Present Illness: Leroy Hines is a 65 y.o. male with a history of chst pain and coronary calcifications  Stress test in Sept 2016 was normal   Rx ASA  Pt did not want statin  Also history of RA I saw him in Dec 2016 Since seen he has done OK  No CP  Breathing is OK  He plays golf often       Current Meds  Medication Sig  . aspirin EC 81 MG tablet Take 81 mg by mouth daily.  . calcium acetate (PHOSLO) 667 MG capsule Take by mouth 3 (three) times daily with meals.  . multivitamin-iron-minerals-folic acid (CENTRUM) chewable tablet Chew 1 tablet by mouth daily.  . Omega-3 Fatty Acids (FISH OIL PO) Take 1 capsule by mouth daily. Take 1 capsule in the morning and 2 capsules at Supper  . TURMERIC PO Take by mouth.  Marland Kitchen VIAGRA 100 MG tablet 50 mg.   . vitamin C (ASCORBIC ACID) 500 MG tablet Take 500 mg by mouth daily.  . Vitamin D, Cholecalciferol, 1000 UNITS TABS Take 1,000 mg by mouth daily.     Allergies:   Sulfamethoxazole-trimethoprim   Past Medical History:  Diagnosis Date  . Arthritis   . Rheumatoid arthritis ([place])   . Sports hernia     Past Surgical History:  Procedure Laterality Date  . SHOULDER SURGERY     right and left.   +   Social History:  The patient  reports that he has never smoked. He has never used smokeless tobacco. He reports that he drinks about 14.4 oz of alcohol per week . He reports that he does not use drugs.   Family History:  The patient's family history includes Cancer in his father and mother; Healthy in his brother; Heart disease in his mother.    ROS:  Please see the history of present illness. All other systems are reviewed and  Negative to the above problem except as noted.    PHYSICAL EXAM: VS:  BP 132/78   Pulse 67   Ht 5\' 10"  (1.778  m)   Wt 197 lb (89.4 kg)   SpO2 97%   BMI 28.27 kg/m   GEN: Well nourished, well developed, in no acute distress  HEENT: normal  Neck: no JVD, carotid bruits, or masses Cardiac: RRR; no murmurs, rubs, or gallops,no edema  Respiratory:  clear to auscultation bilaterally, normal work of breathing GI: soft, nontender, nondistended, + BS  No hepatomegaly  MS: no deformity Moving all extremities   Skin: warm and dry, no rash Neuro:  Strength and sensation are intact Psych: euthymic mood, full affect   EKG:  EKG is ordered today.  SR 66 bpm  rSR' in V1 v   Lipid Panel    Component Value Date/Time   CHOL 178 10/23/2015 0930   TRIG 116 10/23/2015 0930   HDL 53 10/23/2015 0930   CHOLHDL 3.4 10/23/2015 0930   VLDL 23 10/23/2015 0930   LDLCALC 102 10/23/2015 0930      Wt Readings from Last 3 Encounters:  10/30/16 197 lb (89.4 kg)  10/23/15 190 lb 12.8 oz (86.5 kg)  07/18/15 193 lb (87.5 kg)      ASSESSMENT AND PLAN:  1  CAD  No symtpoms to sugg angina  Encouraged him to stay active   He has calcifications noted on CT  Never had cath  Normal myovue in past I will set him up for fasting lipds   LDL in December 2016 was 102  In may 2017 was 114 I will be in touch with him re test resuts  He did not watnt to be on statin in past with toher meds  (Now on Cimzia)  F/U tentatively in 1 year      Current medicines are reviewed at length with the patient today.  The patient does not have concerns regarding medicines.  Signed, Dorris Carnes, MD  10/30/2016 9:19 AM    [facility] [place], [place], [place]  [address] Phone: [phone]; Fax: [phone]

## 2016-10-30 ENCOUNTER — Encounter: Payer: Self-pay | Admitting: Internal Medicine

## 2016-10-30 ENCOUNTER — Ambulatory Visit (INDEPENDENT_AMBULATORY_CARE_PROVIDER_SITE_OTHER): Payer: Medicare Other | Admitting: Internal Medicine

## 2016-10-30 ENCOUNTER — Other Ambulatory Visit (HOSPITAL_COMMUNITY)
Admission: RE | Admit: 2016-10-30 | Discharge: 2016-10-30 | Disposition: A | Discharge status: Home / Self Care | Payer: Medicare Other | Source: Ambulatory Visit | Attending: Internal Medicine | Admitting: Internal Medicine

## 2016-10-30 VITALS — BP 132/78 | HR 67 | Ht 70.0 in | Wt 197.0 lb

## 2016-10-30 DIAGNOSIS — E782 Mixed hyperlipidemia: Secondary | ICD-10-CM

## 2016-10-30 DIAGNOSIS — R002 Palpitations: Secondary | ICD-10-CM | POA: Diagnosis not present

## 2016-10-30 LAB — LIPID PANEL
CHOL/HDL RATIO: 3.5 ratio
CHOLESTEROL: 210 mg/dL — AB (ref 0–200)
HDL: 60 mg/dL (ref >40)
LDL Cholesterol: 121 mg/dL — ABNORMAL HIGH (ref 0–99)
Triglycerides: 147 mg/dL (ref <150)
VLDL: 29 mg/dL (ref 0–40)

## 2016-10-30 NOTE — Patient Instructions (Signed)
Your physician wants you to follow-up in: 1 year Dr Theressa Stamps will receive a reminder letter in the mail two months in advance. If you don't receive a letter, please call our office to schedule the follow-up appointment.     Your physician recommends that you return for lab work in: lipids     Your physician recommends that you continue on your current medications as directed. Please refer to the Current Medication list given to you today.     Thank you for choosing [facility] !

## 2016-11-04 DIAGNOSIS — M0609 Rheumatoid arthritis without rheumatoid factor, multiple sites: Secondary | ICD-10-CM | POA: Diagnosis not present

## 2016-11-20 ENCOUNTER — Telehealth: Payer: Self-pay

## 2016-11-20 ENCOUNTER — Telehealth: Payer: Self-pay | Admitting: Internal Medicine

## 2016-11-20 NOTE — Telephone Encounter (Signed)
Pt hesitant to start statin, wants to research cost and decide if he wants to take it or not.States he will call me back

## 2016-11-20 NOTE — Telephone Encounter (Signed)
Pt still hasn't decided on what he wants to do,his insurance wants generic crestor,which is $17 but he wants to speak with other friends about their experience with statins and call me back

## 2016-11-20 NOTE — Telephone Encounter (Signed)
-----   Message from Fay Records, MD sent at 11/17/2016  8:28 PM EST ----- Reviewed with lipid clinic LDL is 121, higher than it was one year ago With coronary calcifications, he should be on a statin to prevent disease progression Would recomm trial of Crestor 5 mg  Daily  F/U lipids and AST in 8 wks

## 2016-11-20 NOTE — Telephone Encounter (Signed)
Returning Cathey's call concerning his medication

## 2016-12-02 DIAGNOSIS — M0609 Rheumatoid arthritis without rheumatoid factor, multiple sites: Secondary | ICD-10-CM | POA: Diagnosis not present

## 2016-12-02 DIAGNOSIS — I2510 Atherosclerotic heart disease of native coronary artery without angina pectoris: Secondary | ICD-10-CM | POA: Diagnosis not present

## 2016-12-22 DIAGNOSIS — L570 Actinic keratosis: Secondary | ICD-10-CM | POA: Diagnosis not present

## 2016-12-22 DIAGNOSIS — L821 Other seborrheic keratosis: Secondary | ICD-10-CM | POA: Diagnosis not present

## 2016-12-22 DIAGNOSIS — L814 Other melanin hyperpigmentation: Secondary | ICD-10-CM | POA: Diagnosis not present

## 2016-12-22 DIAGNOSIS — D225 Melanocytic nevi of trunk: Secondary | ICD-10-CM | POA: Diagnosis not present

## 2016-12-22 DIAGNOSIS — D1801 Hemangioma of skin and subcutaneous tissue: Secondary | ICD-10-CM | POA: Diagnosis not present

## 2016-12-22 DIAGNOSIS — Z85828 Personal history of other malignant neoplasm of skin: Secondary | ICD-10-CM | POA: Diagnosis not present

## 2017-01-01 DIAGNOSIS — M7580 Other shoulder lesions, unspecified shoulder: Secondary | ICD-10-CM | POA: Diagnosis not present

## 2017-01-01 DIAGNOSIS — M25511 Pain in right shoulder: Secondary | ICD-10-CM | POA: Diagnosis not present

## 2017-01-05 DIAGNOSIS — M0609 Rheumatoid arthritis without rheumatoid factor, multiple sites: Secondary | ICD-10-CM | POA: Diagnosis not present

## 2017-02-04 DIAGNOSIS — Z79899 Other long term (current) drug therapy: Secondary | ICD-10-CM | POA: Diagnosis not present

## 2017-02-04 DIAGNOSIS — M79641 Pain in right hand: Secondary | ICD-10-CM | POA: Diagnosis not present

## 2017-02-04 DIAGNOSIS — E663 Overweight: Secondary | ICD-10-CM | POA: Diagnosis not present

## 2017-02-04 DIAGNOSIS — M0609 Rheumatoid arthritis without rheumatoid factor, multiple sites: Secondary | ICD-10-CM | POA: Diagnosis not present

## 2017-02-04 DIAGNOSIS — Z6828 Body mass index (BMI) 28.0-28.9, adult: Secondary | ICD-10-CM | POA: Diagnosis not present

## 2017-02-08 DIAGNOSIS — M0609 Rheumatoid arthritis without rheumatoid factor, multiple sites: Secondary | ICD-10-CM | POA: Diagnosis not present

## 2017-03-10 DIAGNOSIS — M0609 Rheumatoid arthritis without rheumatoid factor, multiple sites: Secondary | ICD-10-CM | POA: Diagnosis not present

## 2017-04-05 DIAGNOSIS — R252 Cramp and spasm: Secondary | ICD-10-CM | POA: Diagnosis not present

## 2017-04-05 DIAGNOSIS — M069 Rheumatoid arthritis, unspecified: Secondary | ICD-10-CM | POA: Diagnosis not present

## 2017-04-05 DIAGNOSIS — E559 Vitamin D deficiency, unspecified: Secondary | ICD-10-CM | POA: Diagnosis not present

## 2017-04-05 DIAGNOSIS — Z0001 Encounter for general adult medical examination with abnormal findings: Secondary | ICD-10-CM | POA: Diagnosis not present

## 2017-04-05 DIAGNOSIS — I700 Atherosclerosis of aorta: Secondary | ICD-10-CM | POA: Diagnosis not present

## 2017-04-05 DIAGNOSIS — E669 Obesity, unspecified: Secondary | ICD-10-CM | POA: Diagnosis not present

## 2017-04-05 DIAGNOSIS — Z125 Encounter for screening for malignant neoplasm of prostate: Secondary | ICD-10-CM | POA: Diagnosis not present

## 2017-04-05 DIAGNOSIS — I2510 Atherosclerotic heart disease of native coronary artery without angina pectoris: Secondary | ICD-10-CM | POA: Diagnosis not present

## 2017-04-05 DIAGNOSIS — K4090 Unilateral inguinal hernia, without obstruction or gangrene, not specified as recurrent: Secondary | ICD-10-CM | POA: Diagnosis not present

## 2017-04-05 DIAGNOSIS — Z79899 Other long term (current) drug therapy: Secondary | ICD-10-CM | POA: Diagnosis not present

## 2017-04-05 DIAGNOSIS — M545 Low back pain: Secondary | ICD-10-CM | POA: Diagnosis not present

## 2017-04-05 DIAGNOSIS — Z800 Family history of malignant neoplasm of digestive organs: Secondary | ICD-10-CM | POA: Diagnosis not present

## 2017-04-05 DIAGNOSIS — N529 Male erectile dysfunction, unspecified: Secondary | ICD-10-CM | POA: Diagnosis not present

## 2017-04-05 DIAGNOSIS — M1990 Unspecified osteoarthritis, unspecified site: Secondary | ICD-10-CM | POA: Diagnosis not present

## 2017-04-05 DIAGNOSIS — E782 Mixed hyperlipidemia: Secondary | ICD-10-CM | POA: Diagnosis not present

## 2017-04-12 DIAGNOSIS — M0609 Rheumatoid arthritis without rheumatoid factor, multiple sites: Secondary | ICD-10-CM | POA: Diagnosis not present

## 2017-04-13 DIAGNOSIS — Z6827 Body mass index (BMI) 27.0-27.9, adult: Secondary | ICD-10-CM | POA: Diagnosis not present

## 2017-04-13 DIAGNOSIS — M79641 Pain in right hand: Secondary | ICD-10-CM | POA: Diagnosis not present

## 2017-04-13 DIAGNOSIS — E663 Overweight: Secondary | ICD-10-CM | POA: Diagnosis not present

## 2017-04-13 DIAGNOSIS — Z79899 Other long term (current) drug therapy: Secondary | ICD-10-CM | POA: Diagnosis not present

## 2017-04-13 DIAGNOSIS — M0609 Rheumatoid arthritis without rheumatoid factor, multiple sites: Secondary | ICD-10-CM | POA: Diagnosis not present

## 2017-05-03 DIAGNOSIS — K4090 Unilateral inguinal hernia, without obstruction or gangrene, not specified as recurrent: Secondary | ICD-10-CM | POA: Diagnosis not present

## 2017-05-03 DIAGNOSIS — K429 Umbilical hernia without obstruction or gangrene: Secondary | ICD-10-CM | POA: Diagnosis not present

## 2017-05-12 DIAGNOSIS — K429 Umbilical hernia without obstruction or gangrene: Secondary | ICD-10-CM | POA: Diagnosis not present

## 2017-05-12 DIAGNOSIS — M0609 Rheumatoid arthritis without rheumatoid factor, multiple sites: Secondary | ICD-10-CM | POA: Diagnosis not present

## 2017-05-12 DIAGNOSIS — K4090 Unilateral inguinal hernia, without obstruction or gangrene, not specified as recurrent: Secondary | ICD-10-CM | POA: Diagnosis not present

## 2017-05-25 DIAGNOSIS — K649 Unspecified hemorrhoids: Secondary | ICD-10-CM | POA: Diagnosis not present

## 2017-05-25 DIAGNOSIS — Z800 Family history of malignant neoplasm of digestive organs: Secondary | ICD-10-CM | POA: Diagnosis not present

## 2017-05-25 DIAGNOSIS — D126 Benign neoplasm of colon, unspecified: Secondary | ICD-10-CM | POA: Diagnosis not present

## 2017-05-25 DIAGNOSIS — K5730 Diverticulosis of large intestine without perforation or abscess without bleeding: Secondary | ICD-10-CM | POA: Diagnosis not present

## 2017-05-25 DIAGNOSIS — Z86010 Personal history of colonic polyps: Secondary | ICD-10-CM | POA: Diagnosis not present

## 2017-05-27 DIAGNOSIS — D126 Benign neoplasm of colon, unspecified: Secondary | ICD-10-CM | POA: Diagnosis not present

## 2017-06-10 DIAGNOSIS — M0609 Rheumatoid arthritis without rheumatoid factor, multiple sites: Secondary | ICD-10-CM | POA: Diagnosis not present

## 2017-07-12 DIAGNOSIS — M0609 Rheumatoid arthritis without rheumatoid factor, multiple sites: Secondary | ICD-10-CM | POA: Diagnosis not present

## 2017-08-16 DIAGNOSIS — Z79899 Other long term (current) drug therapy: Secondary | ICD-10-CM | POA: Diagnosis not present

## 2017-08-16 DIAGNOSIS — M0609 Rheumatoid arthritis without rheumatoid factor, multiple sites: Secondary | ICD-10-CM | POA: Diagnosis not present

## 2017-08-30 DIAGNOSIS — Z23 Encounter for immunization: Secondary | ICD-10-CM | POA: Diagnosis not present

## 2017-08-30 DIAGNOSIS — E782 Mixed hyperlipidemia: Secondary | ICD-10-CM | POA: Diagnosis not present

## 2017-08-30 DIAGNOSIS — M069 Rheumatoid arthritis, unspecified: Secondary | ICD-10-CM | POA: Diagnosis not present

## 2017-08-30 DIAGNOSIS — M0689 Other specified rheumatoid arthritis, multiple sites: Secondary | ICD-10-CM | POA: Diagnosis not present

## 2017-08-30 DIAGNOSIS — G479 Sleep disorder, unspecified: Secondary | ICD-10-CM | POA: Diagnosis not present

## 2017-08-30 DIAGNOSIS — I2510 Atherosclerotic heart disease of native coronary artery without angina pectoris: Secondary | ICD-10-CM | POA: Diagnosis not present

## 2017-08-30 DIAGNOSIS — I4510 Unspecified right bundle-branch block: Secondary | ICD-10-CM | POA: Diagnosis not present

## 2017-08-30 DIAGNOSIS — I700 Atherosclerosis of aorta: Secondary | ICD-10-CM | POA: Diagnosis not present

## 2017-08-30 DIAGNOSIS — E559 Vitamin D deficiency, unspecified: Secondary | ICD-10-CM | POA: Diagnosis not present

## 2017-08-30 DIAGNOSIS — M1990 Unspecified osteoarthritis, unspecified site: Secondary | ICD-10-CM | POA: Diagnosis not present

## 2017-08-30 DIAGNOSIS — N529 Male erectile dysfunction, unspecified: Secondary | ICD-10-CM | POA: Diagnosis not present

## 2017-09-01 DIAGNOSIS — T63481A Toxic effect of venom of other arthropod, accidental (unintentional), initial encounter: Secondary | ICD-10-CM | POA: Diagnosis not present

## 2017-09-13 DIAGNOSIS — M0609 Rheumatoid arthritis without rheumatoid factor, multiple sites: Secondary | ICD-10-CM | POA: Diagnosis not present

## 2017-10-13 DIAGNOSIS — E663 Overweight: Secondary | ICD-10-CM | POA: Diagnosis not present

## 2017-10-13 DIAGNOSIS — Z79899 Other long term (current) drug therapy: Secondary | ICD-10-CM | POA: Diagnosis not present

## 2017-10-13 DIAGNOSIS — Z6829 Body mass index (BMI) 29.0-29.9, adult: Secondary | ICD-10-CM | POA: Diagnosis not present

## 2017-10-13 DIAGNOSIS — M0609 Rheumatoid arthritis without rheumatoid factor, multiple sites: Secondary | ICD-10-CM | POA: Diagnosis not present

## 2017-10-13 DIAGNOSIS — M79641 Pain in right hand: Secondary | ICD-10-CM | POA: Diagnosis not present

## 2017-10-14 DIAGNOSIS — M0609 Rheumatoid arthritis without rheumatoid factor, multiple sites: Secondary | ICD-10-CM | POA: Diagnosis not present

## 2017-10-28 ENCOUNTER — Ambulatory Visit: Payer: Medicare Other | Admitting: Internal Medicine

## 2017-11-11 DIAGNOSIS — M0609 Rheumatoid arthritis without rheumatoid factor, multiple sites: Secondary | ICD-10-CM | POA: Diagnosis not present

## 2017-12-02 ENCOUNTER — Encounter: Payer: Self-pay | Admitting: Internal Medicine

## 2017-12-10 ENCOUNTER — Ambulatory Visit (INDEPENDENT_AMBULATORY_CARE_PROVIDER_SITE_OTHER): Payer: Medicare Other | Admitting: Internal Medicine

## 2017-12-10 ENCOUNTER — Other Ambulatory Visit: Payer: Self-pay

## 2017-12-10 ENCOUNTER — Encounter: Payer: Self-pay | Admitting: Internal Medicine

## 2017-12-10 VITALS — BP 128/80 | HR 62 | Ht 70.0 in | Wt 196.0 lb

## 2017-12-10 DIAGNOSIS — E782 Mixed hyperlipidemia: Secondary | ICD-10-CM

## 2017-12-10 DIAGNOSIS — I2510 Atherosclerotic heart disease of native coronary artery without angina pectoris: Secondary | ICD-10-CM

## 2017-12-10 LAB — LIPID PANEL
CHOL/HDL RATIO: 3.2 ratio (ref 0.0–5.0)
Cholesterol, Total: 166 mg/dL (ref 100–199)
HDL: 52 mg/dL (ref >39)
LDL Calculated: 101 mg/dL — ABNORMAL HIGH (ref 0–99)
TRIGLYCERIDES: 66 mg/dL (ref 0–149)
VLDL Cholesterol Cal: 13 mg/dL (ref 5–40)

## 2017-12-10 MED ORDER — PRAVASTATIN SODIUM 20 MG PO TABS: 20.0000 mg | ORAL_TABLET | Freq: Every evening | ORAL | 3 refills | Status: DC

## 2017-12-10 NOTE — Progress Notes (Signed)
Cardiology Office Note   Date:  12/10/2017   ID:  ABE SCHOOLS, DOB Jan 01, 1951, MRN [id number]  PCP:  Josetta Huddle, MD  Cardiologist:   Dorris Carnes, MD    F/U of CP   History of Present Illness: Leroy Hines is a 67 y.o. male with a history of chst pain and coronary calcifications  Stress test in Sept 2016 was normal   Rx ASA  Pt did not want statin  Also history of RA I saw him in Dec 2016  Since seen he has done OK  No CP  Breathing is OK  He plays golf often       Current Meds  Medication Sig  . aspirin EC 81 MG tablet Take 81 mg by mouth daily.  Marland Kitchen CALCIUM-MAGNESIUM-ZINC PO Take by mouth daily.  . Certolizumab Pegol (CIMZIA [place]) Inject into the skin every 30 (thirty) days.  . multivitamin-iron-minerals-folic acid (CENTRUM) chewable tablet Chew 1 tablet by mouth daily.  . Omega-3 Fatty Acids (FISH OIL PO) Take 1,000 mg by mouth daily.  . sildenafil (REVATIO) 20 MG tablet Take 20 mg by mouth as directed.  . TURMERIC PO Take 500 mg by mouth daily.   . vitamin C (ASCORBIC ACID) 500 MG tablet Take 500 mg by mouth daily.  . Vitamin D, Cholecalciferol, 1000 UNITS TABS Take 1,000 mg by mouth daily.     Allergies:   Celecoxib and Sulfamethoxazole-trimethoprim   Past Medical History:  Diagnosis Date  . Arthritis   . Rheumatoid arthritis ([place])   . Sports hernia     Past Surgical History:  Procedure Laterality Date  . SHOULDER SURGERY     right and left.   +   Social History:  The patient  reports that  has never smoked. he has never used smokeless tobacco. He reports that he drinks about 14.4 oz of alcohol per week. He reports that he does not use drugs.   Family History:  The patient's family history includes Cancer in his father and mother; Healthy in his brother; Heart disease in his mother.    ROS:  Please see the history of present illness. All other systems are reviewed and  Negative to the above problem except as noted.    PHYSICAL EXAM: VS:   BP 128/80   Pulse 62   Ht 5\' 10"  (1.778 m)   Wt 196 lb (88.9 kg)   BMI 28.12 kg/m   GEN: Well nourished, well developed, in no acute distress  HEENT: normal  Neck: no JVD, carotid bruits, or masses Cardiac: RRR; no murmurs, rubs, or gallops,no edema  Respiratory:  clear to auscultation bilaterally, normal work of breathing GI: soft, nontender, nondistended, + BS  No hepatomegaly  MS: no deformity Moving all extremities   Skin: warm and dry, no rash Neuro:  Strength and sensation are intact Psych: euthymic mood, full affect   EKG:  EKG is ordered today.  SR 63 bpm  Incomp RBBB  Lipid Panel    Component Value Date/Time   CHOL 210 (H) 10/30/2016 0945   TRIG 147 10/30/2016 0945   HDL 60 10/30/2016 0945   CHOLHDL 3.5 10/30/2016 0945   VLDL 29 10/30/2016 0945   LDLCALC 121 (H) 10/30/2016 0945      Wt Readings from Last 3 Encounters:  12/10/17 196 lb (88.9 kg)  10/30/16 197 lb (89.4 kg)  10/23/15 190 lb 12.8 oz (86.5 kg)      ASSESSMENT AND PLAN:  1  CAD  No symptoms of angina  2  HL  LDL in Jne was 109  HDL 64  Will recheck today He is willing to try Pravastatin  20 mg  F/U lipid in 2 months   Call back if doesn't tolerate  Could consider Repatha at that time     F/U tentatively in 1 year      Current medicines are reviewed at length with the patient today.  The patient does not have concerns regarding medicines.  Signed, Dorris Carnes, MD  12/10/2017 10:22 AM    [facility] [place], [place], [place]  [address] Phone: [phone]; Fax: [phone]

## 2017-12-10 NOTE — Patient Instructions (Signed)
Your physician has recommended you make the following change in your medication:  1.) start pravastatin 20 mg once daily for cholesterol  Your physician recommends that you return for lab work [facility] wants you to follow-up in: [place].  You will receive a reminder letter in the mail two months in advance. If you don't receive a letter, please call our office to schedule the follow-up appointment.

## 2017-12-14 DIAGNOSIS — M0609 Rheumatoid arthritis without rheumatoid factor, multiple sites: Secondary | ICD-10-CM | POA: Diagnosis not present

## 2017-12-27 ENCOUNTER — Encounter: Payer: Self-pay | Admitting: Internal Medicine

## 2017-12-27 DIAGNOSIS — D1801 Hemangioma of skin and subcutaneous tissue: Secondary | ICD-10-CM | POA: Diagnosis not present

## 2017-12-27 DIAGNOSIS — L570 Actinic keratosis: Secondary | ICD-10-CM | POA: Diagnosis not present

## 2017-12-27 DIAGNOSIS — L821 Other seborrheic keratosis: Secondary | ICD-10-CM | POA: Diagnosis not present

## 2017-12-27 DIAGNOSIS — E782 Mixed hyperlipidemia: Secondary | ICD-10-CM

## 2017-12-27 DIAGNOSIS — D2271 Melanocytic nevi of right lower limb, including hip: Secondary | ICD-10-CM | POA: Diagnosis not present

## 2017-12-27 DIAGNOSIS — D2262 Melanocytic nevi of left upper limb, including shoulder: Secondary | ICD-10-CM | POA: Diagnosis not present

## 2017-12-27 DIAGNOSIS — D225 Melanocytic nevi of trunk: Secondary | ICD-10-CM | POA: Diagnosis not present

## 2017-12-27 DIAGNOSIS — Z85828 Personal history of other malignant neoplasm of skin: Secondary | ICD-10-CM | POA: Diagnosis not present

## 2018-01-03 ENCOUNTER — Encounter: Payer: Self-pay | Admitting: Internal Medicine

## 2018-01-03 MED ORDER — EZETIMIBE 10 MG PO TABS: 10.0000 mg | ORAL_TABLET | Freq: Every day | ORAL | 3 refills | Status: DC

## 2018-01-03 MED ORDER — EZETIMIBE 10 MG PO TABS: 10.0000 mg | ORAL_TABLET | Freq: Every day | ORAL | 6 refills | Status: DC

## 2018-01-03 NOTE — Telephone Encounter (Signed)
Patient called in. Prefers [facility] for zetia.  Will pay cash through [facility] 90 day supply.

## 2018-01-03 NOTE — Telephone Encounter (Signed)
Called patient. He had already stopped pravastatin.  Muscle soreness went away. He will start Zetia. Sent 30 day supply to his pharmacy and explained option for [facility] if copay is too high.  He will have labs at PCP in about 2 months and have lipids forwarded to Dr. Harrington Challenger.

## 2018-01-11 ENCOUNTER — Encounter: Payer: Self-pay | Admitting: Internal Medicine

## 2018-01-11 NOTE — Telephone Encounter (Signed)
Spoke with patient about his Zetia, which he is taking half a tablet (5 mg) daily.Marland Kitchen  He wants to ease into the medication because of fear of muscle weakness and soreness.Marland Kitchen  He is starting to experience a head cold with sneezing and runny nose so he started taking Claritin and wanted to know if he should resume Zetia after missing 1 day..   I told him to monitor mild muscle soreness and weakness along with cold symptoms.Marland Kitchen

## 2018-01-17 DIAGNOSIS — M0609 Rheumatoid arthritis without rheumatoid factor, multiple sites: Secondary | ICD-10-CM | POA: Diagnosis not present

## 2018-02-17 DIAGNOSIS — M0609 Rheumatoid arthritis without rheumatoid factor, multiple sites: Secondary | ICD-10-CM | POA: Diagnosis not present

## 2018-03-17 DIAGNOSIS — M0609 Rheumatoid arthritis without rheumatoid factor, multiple sites: Secondary | ICD-10-CM | POA: Diagnosis not present

## 2018-03-17 DIAGNOSIS — Z79899 Other long term (current) drug therapy: Secondary | ICD-10-CM | POA: Diagnosis not present

## 2018-04-13 DIAGNOSIS — Z79899 Other long term (current) drug therapy: Secondary | ICD-10-CM | POA: Diagnosis not present

## 2018-04-13 DIAGNOSIS — M0609 Rheumatoid arthritis without rheumatoid factor, multiple sites: Secondary | ICD-10-CM | POA: Diagnosis not present

## 2018-04-13 DIAGNOSIS — Z6827 Body mass index (BMI) 27.0-27.9, adult: Secondary | ICD-10-CM | POA: Diagnosis not present

## 2018-04-13 DIAGNOSIS — M79641 Pain in right hand: Secondary | ICD-10-CM | POA: Diagnosis not present

## 2018-04-13 DIAGNOSIS — M1990 Unspecified osteoarthritis, unspecified site: Secondary | ICD-10-CM | POA: Diagnosis not present

## 2018-04-13 DIAGNOSIS — N529 Male erectile dysfunction, unspecified: Secondary | ICD-10-CM | POA: Diagnosis not present

## 2018-04-13 DIAGNOSIS — M0689 Other specified rheumatoid arthritis, multiple sites: Secondary | ICD-10-CM | POA: Diagnosis not present

## 2018-04-13 DIAGNOSIS — Z1389 Encounter for screening for other disorder: Secondary | ICD-10-CM | POA: Diagnosis not present

## 2018-04-13 DIAGNOSIS — E782 Mixed hyperlipidemia: Secondary | ICD-10-CM | POA: Diagnosis not present

## 2018-04-13 DIAGNOSIS — I2510 Atherosclerotic heart disease of native coronary artery without angina pectoris: Secondary | ICD-10-CM | POA: Diagnosis not present

## 2018-04-13 DIAGNOSIS — E663 Overweight: Secondary | ICD-10-CM | POA: Diagnosis not present

## 2018-04-13 DIAGNOSIS — M069 Rheumatoid arthritis, unspecified: Secondary | ICD-10-CM | POA: Diagnosis not present

## 2018-04-13 DIAGNOSIS — Z0001 Encounter for general adult medical examination with abnormal findings: Secondary | ICD-10-CM | POA: Diagnosis not present

## 2018-04-14 DIAGNOSIS — M0609 Rheumatoid arthritis without rheumatoid factor, multiple sites: Secondary | ICD-10-CM | POA: Diagnosis not present

## 2018-05-12 DIAGNOSIS — M0609 Rheumatoid arthritis without rheumatoid factor, multiple sites: Secondary | ICD-10-CM | POA: Diagnosis not present

## 2018-06-09 DIAGNOSIS — M0609 Rheumatoid arthritis without rheumatoid factor, multiple sites: Secondary | ICD-10-CM | POA: Diagnosis not present

## 2018-07-11 DIAGNOSIS — M0609 Rheumatoid arthritis without rheumatoid factor, multiple sites: Secondary | ICD-10-CM | POA: Diagnosis not present

## 2018-08-04 DIAGNOSIS — M0609 Rheumatoid arthritis without rheumatoid factor, multiple sites: Secondary | ICD-10-CM | POA: Diagnosis not present

## 2018-08-24 DIAGNOSIS — E559 Vitamin D deficiency, unspecified: Secondary | ICD-10-CM | POA: Diagnosis not present

## 2018-08-24 DIAGNOSIS — I2510 Atherosclerotic heart disease of native coronary artery without angina pectoris: Secondary | ICD-10-CM | POA: Diagnosis not present

## 2018-08-24 DIAGNOSIS — M069 Rheumatoid arthritis, unspecified: Secondary | ICD-10-CM | POA: Diagnosis not present

## 2018-08-24 DIAGNOSIS — E782 Mixed hyperlipidemia: Secondary | ICD-10-CM | POA: Diagnosis not present

## 2018-08-24 DIAGNOSIS — I700 Atherosclerosis of aorta: Secondary | ICD-10-CM | POA: Diagnosis not present

## 2018-08-29 DIAGNOSIS — M1990 Unspecified osteoarthritis, unspecified site: Secondary | ICD-10-CM | POA: Diagnosis not present

## 2018-08-29 DIAGNOSIS — I2510 Atherosclerotic heart disease of native coronary artery without angina pectoris: Secondary | ICD-10-CM | POA: Diagnosis not present

## 2018-08-29 DIAGNOSIS — M0689 Other specified rheumatoid arthritis, multiple sites: Secondary | ICD-10-CM | POA: Diagnosis not present

## 2018-08-29 DIAGNOSIS — M069 Rheumatoid arthritis, unspecified: Secondary | ICD-10-CM | POA: Diagnosis not present

## 2018-08-29 DIAGNOSIS — E782 Mixed hyperlipidemia: Secondary | ICD-10-CM | POA: Diagnosis not present

## 2018-09-06 DIAGNOSIS — M0609 Rheumatoid arthritis without rheumatoid factor, multiple sites: Secondary | ICD-10-CM | POA: Diagnosis not present

## 2018-10-04 DIAGNOSIS — M0609 Rheumatoid arthritis without rheumatoid factor, multiple sites: Secondary | ICD-10-CM | POA: Diagnosis not present

## 2018-10-05 ENCOUNTER — Telehealth: Payer: Self-pay | Admitting: Internal Medicine

## 2018-10-05 NOTE — Telephone Encounter (Signed)
Pt is wanting to make sure he has his labs done prior to his apt in March 2020, there are lab orders that are active at this time, can we mail that to him?

## 2018-10-13 DIAGNOSIS — M0609 Rheumatoid arthritis without rheumatoid factor, multiple sites: Secondary | ICD-10-CM | POA: Diagnosis not present

## 2018-10-13 DIAGNOSIS — M79641 Pain in right hand: Secondary | ICD-10-CM | POA: Diagnosis not present

## 2018-10-13 DIAGNOSIS — E663 Overweight: Secondary | ICD-10-CM | POA: Diagnosis not present

## 2018-10-13 DIAGNOSIS — Z6828 Body mass index (BMI) 28.0-28.9, adult: Secondary | ICD-10-CM | POA: Diagnosis not present

## 2018-10-13 DIAGNOSIS — Z79899 Other long term (current) drug therapy: Secondary | ICD-10-CM | POA: Diagnosis not present

## 2018-11-03 DIAGNOSIS — M0609 Rheumatoid arthritis without rheumatoid factor, multiple sites: Secondary | ICD-10-CM | POA: Diagnosis not present

## 2018-12-01 DIAGNOSIS — M0609 Rheumatoid arthritis without rheumatoid factor, multiple sites: Secondary | ICD-10-CM | POA: Diagnosis not present

## 2018-12-27 DIAGNOSIS — D2261 Melanocytic nevi of right upper limb, including shoulder: Secondary | ICD-10-CM | POA: Diagnosis not present

## 2018-12-27 DIAGNOSIS — L738 Other specified follicular disorders: Secondary | ICD-10-CM | POA: Diagnosis not present

## 2018-12-27 DIAGNOSIS — D225 Melanocytic nevi of trunk: Secondary | ICD-10-CM | POA: Diagnosis not present

## 2018-12-27 DIAGNOSIS — D2262 Melanocytic nevi of left upper limb, including shoulder: Secondary | ICD-10-CM | POA: Diagnosis not present

## 2018-12-27 DIAGNOSIS — Z85828 Personal history of other malignant neoplasm of skin: Secondary | ICD-10-CM | POA: Diagnosis not present

## 2018-12-27 DIAGNOSIS — L821 Other seborrheic keratosis: Secondary | ICD-10-CM | POA: Diagnosis not present

## 2018-12-27 DIAGNOSIS — L570 Actinic keratosis: Secondary | ICD-10-CM | POA: Diagnosis not present

## 2018-12-27 DIAGNOSIS — D1801 Hemangioma of skin and subcutaneous tissue: Secondary | ICD-10-CM | POA: Diagnosis not present

## 2018-12-27 DIAGNOSIS — L814 Other melanin hyperpigmentation: Secondary | ICD-10-CM | POA: Diagnosis not present

## 2018-12-29 DIAGNOSIS — M0609 Rheumatoid arthritis without rheumatoid factor, multiple sites: Secondary | ICD-10-CM | POA: Diagnosis not present

## 2019-01-03 ENCOUNTER — Telehealth: Payer: Self-pay | Admitting: Internal Medicine

## 2019-01-03 DIAGNOSIS — E782 Mixed hyperlipidemia: Secondary | ICD-10-CM

## 2019-01-03 NOTE — Telephone Encounter (Signed)
Pt will pick up lab slip at desk

## 2019-01-03 NOTE — Telephone Encounter (Signed)
Patient is requesting order for Lipid Panel prior to seeing Dr.Ross on 3/16. / tg

## 2019-01-09 ENCOUNTER — Other Ambulatory Visit (HOSPITAL_COMMUNITY)
Admission: RE | Admit: 2019-01-09 | Discharge: 2019-01-09 | Disposition: A | Discharge status: Home / Self Care | Payer: Medicare Other | Source: Ambulatory Visit | Attending: Internal Medicine | Admitting: Internal Medicine

## 2019-01-09 DIAGNOSIS — E782 Mixed hyperlipidemia: Secondary | ICD-10-CM | POA: Diagnosis not present

## 2019-01-09 LAB — LIPID PANEL
CHOL/HDL RATIO: 2.7 ratio
Cholesterol: 159 mg/dL (ref 0–200)
HDL: 58 mg/dL (ref >40)
LDL Cholesterol: 84 mg/dL (ref 0–99)
Triglycerides: 83 mg/dL (ref <150)
VLDL: 17 mg/dL (ref 0–40)

## 2019-01-15 ENCOUNTER — Telehealth: Payer: Self-pay | Admitting: Internal Medicine

## 2019-01-15 NOTE — Telephone Encounter (Signed)
Called pt  No CP   No SOB  Active  On red yeast rice   Not on pravachol Reviwed labs    I had sugg that he add Zetia   Will discuss on return visit He will work on diet.  He feels good   With current viral illness concerns told him we would call to resched later this summer when eases

## 2019-01-16 ENCOUNTER — Ambulatory Visit: Payer: Medicare Other | Admitting: Internal Medicine

## 2019-01-16 NOTE — Telephone Encounter (Signed)
Will forward to front desk staff to r/s

## 2019-01-30 DIAGNOSIS — M0609 Rheumatoid arthritis without rheumatoid factor, multiple sites: Secondary | ICD-10-CM | POA: Diagnosis not present

## 2019-02-28 DIAGNOSIS — M0609 Rheumatoid arthritis without rheumatoid factor, multiple sites: Secondary | ICD-10-CM | POA: Diagnosis not present

## 2019-03-14 DIAGNOSIS — R109 Unspecified abdominal pain: Secondary | ICD-10-CM | POA: Diagnosis not present

## 2019-03-15 ENCOUNTER — Telehealth: Payer: Self-pay | Admitting: Internal Medicine

## 2019-03-15 NOTE — Telephone Encounter (Signed)
Virtual Visit Pre-Appointment Phone Call  "(Name), I am calling you today to discuss your upcoming appointment. We are currently trying to limit exposure to the virus that causes COVID-19 by seeing patients at home rather than in the office."  1. "What is the BEST phone number to call the day of the visit?" - include this in appointment notes  2. Do you have or have access to (through a family member/friend) a smartphone with video capability that we can use for your visit?" a. If yes - list this number in appt notes as cell (if different from BEST phone #) and list the appointment type as a VIDEO visit in appointment notes b. If no - list the appointment type as a PHONE visit in appointment notes  3. Confirm consent - "In the setting of the current Covid19 crisis, you are scheduled for a (phone or video) visit with your provider on (date) at (time).  Just as we do with many in-office visits, in order for you to participate in this visit, we must obtain consent.  If you'd like, I can send this to your mychart (if signed up) or email for you to review.  Otherwise, I can obtain your verbal consent now.  All virtual visits are billed to your insurance company just like a normal visit would be.  By agreeing to a virtual visit, we'd like you to understand that the technology does not allow for your provider to perform an examination, and thus may limit your provider's ability to fully assess your condition. If your provider identifies any concerns that need to be evaluated in person, we will make arrangements to do so.  Finally, though the technology is pretty good, we cannot assure that it will always work on either your or our end, and in the setting of a video visit, we may have to convert it to a phone-only visit.  In either situation, we cannot ensure that we have a secure connection.  Are you willing to proceed?" STAFF: Did the patient verbally acknowledge consent to telehealth visit? Document  YES/NO here: Yes  4. Advise patient to be prepared - "Two hours prior to your appointment, go ahead and check your blood pressure, pulse, oxygen saturation, and your weight (if you have the equipment to check those) and write them all down. When your visit starts, your provider will ask you for this information. If you have an Apple Watch or Kardia device, please plan to have heart rate information ready on the day of your appointment. Please have a pen and paper handy nearby the day of the visit as well."  5. Give patient instructions for MyChart download to smartphone OR [email or web address] as below if video visit (depending on what platform provider is using)  6. Inform patient they will receive a phone call 15 minutes prior to their appointment time (may be from unknown caller ID) so they should be prepared to answer    TELEPHONE CALL NOTE  Leroy Hines has been deemed a candidate for a follow-up tele-health visit to limit community exposure during the Covid-19 pandemic. I spoke with the patient via phone to ensure availability of phone/video source, confirm preferred email & phone number, and discuss instructions and expectations.  I reminded Leroy Hines to be prepared with any vital sign and/or heart rhythm information that could potentially be obtained via home monitoring, at the time of his visit. I reminded Leroy Hines to expect a phone call prior to  his visit.  Orinda Kenner 03/15/2019 10:32 AM

## 2019-03-20 ENCOUNTER — Telehealth (INDEPENDENT_AMBULATORY_CARE_PROVIDER_SITE_OTHER): Payer: Medicare Other | Admitting: Internal Medicine

## 2019-03-20 ENCOUNTER — Encounter: Payer: Self-pay | Admitting: Internal Medicine

## 2019-03-20 VITALS — Ht 70.0 in | Wt 182.0 lb

## 2019-03-20 DIAGNOSIS — E782 Mixed hyperlipidemia: Secondary | ICD-10-CM

## 2019-03-20 DIAGNOSIS — I2510 Atherosclerotic heart disease of native coronary artery without angina pectoris: Secondary | ICD-10-CM

## 2019-03-20 NOTE — Patient Instructions (Signed)
Medication Instructions: Your physician recommends that you continue on your current medications as directed. Please refer to the Current Medication list given to you today.   Labwork: None today  Procedures/Testing: None today  Follow-Up: February 2021 with Dr.Ross  Any Additional Special Instructions Will Be Listed Below (If Applicable).     If you need a refill on your cardiac medications before your next appointment, please call your pharmacy.

## 2019-03-20 NOTE — Progress Notes (Signed)
Virtual Visit via Telephone Note   This visit type was conducted due to national recommendations for restrictions regarding the COVID-19 Pandemic (e.g. social distancing) in an effort to limit this patient's exposure and mitigate transmission in our community.  Due to his co-morbid illnesses, this patient is at least at moderate risk for complications without adequate follow up.  This format is felt to be most appropriate for this patient at this time.  The patient did not have access to video technology/had technical difficulties with video requiring transitioning to audio format only (telephone).  All issues noted in this document were discussed and addressed.  No physical exam could be performed with this format.  Please refer to the patient's chart for his  consent to telehealth for [facility].   Date:  03/20/2019   ID:  Leroy Hines, DOB 24-Apr-1951, MRN [id number]  Patient Location: Home Provider Location: Home  PCP:  Josetta Huddle, MD  Cardiologist:  Dorris Carnes, MD Electrophysiologist:  None   Evaluation Performed:  Follow-Up Visit  Chief Complaint:    History of Present Illness:    Leroy Hines is a 68 y.o. male with hx of chest pain and coronary calcifications.   Stress tst in Sept 2016  Recom ASA   Pt did not want statin I last saw him in clinic in Feb 2019    I spoke with him in March   DOing OK at time   ON red yeast rice not pravachol   Reviewed lipids     The patient does not have symptoms concerning for COVID-19 infection (fever, chills, cough, or new shortness of breath).    Past Medical History:  Diagnosis Date  . Arthritis   . Rheumatoid arthritis ([place])   . Sports hernia    Past Surgical History:  Procedure Laterality Date  . SHOULDER SURGERY     right and left.     Current Meds  Medication Sig  . aspirin EC 81 MG tablet Take 81 mg by mouth daily.  Marland Kitchen CALCIUM-MAGNESIUM-ZINC PO Take by mouth daily.  . Certolizumab Pegol (CIMZIA [place]) Inject  into the skin every 30 (thirty) days.  . multivitamin-iron-minerals-folic acid (CENTRUM) chewable tablet Chew 1 tablet by mouth daily.  . Omega-3 Fatty Acids (FISH OIL PO) Take 1,000 mg by mouth daily.  . Red Yeast Rice 600 MG CAPS Take 600 capsules by mouth 2 (two) times a day.  . sildenafil (REVATIO) 20 MG tablet Take 20 mg by mouth as directed.  . TURMERIC PO Take 500 mg by mouth daily.   . vitamin C (ASCORBIC ACID) 500 MG tablet Take 500 mg by mouth daily.  . Vitamin D, Cholecalciferol, 1000 UNITS TABS Take 1,000 mg by mouth daily.     Allergies:   Celecoxib and Sulfamethoxazole-trimethoprim   Social History   Tobacco Use  . Smoking status: Never Smoker  . Smokeless tobacco: Never Used  Substance Use Topics  . Alcohol use: Yes    Alcohol/week: 24.0 standard drinks    Types: 24 Cans of beer per week  . Drug use: No     Family Hx: The patient's family history includes Cancer in his father and mother; Healthy in his brother; Heart disease in his mother.  ROS:   Please see the history of present illness.    All other systems reviewed and are negative.   Prior CV studies:   The following studies were reviewed today:   Labs/Other Tests and Data Reviewed:  EKG:  No ECG reviewed.  Recent Labs: No results found for requested labs within last 8760 hours.   Recent Lipid Panel Lab Results  Component Value Date/Time   CHOL 159 01/09/2019 09:48 AM   CHOL 166 12/10/2017 10:52 AM   TRIG 83 01/09/2019 09:48 AM   HDL 58 01/09/2019 09:48 AM   HDL 52 12/10/2017 10:52 AM   CHOLHDL 2.7 01/09/2019 09:48 AM   LDLCALC 84 01/09/2019 09:48 AM   LDLCALC 101 (H) 12/10/2017 10:52 AM    Wt Readings from Last 3 Encounters:  03/20/19 182 lb (82.6 kg)  12/10/17 196 lb (88.9 kg)  10/30/16 197 lb (89.4 kg)     Objective:    Vital Signs:  Ht 5\' 10"  (1.778 m)   Wt 182 lb (82.6 kg)   BMI 26.11 kg/m    No vitals to review   ASSESSMENT & PLAN:    1. CAD  No symptoms to sugg  ischemia  2  HL  LIpids noted above   Pt has a physical with Dr Inda Merlin this summer   Will probably check again   LDL is fair   He is sensitive to meds   On red yeast rice    Doing OK   Has lost wt since labs last checked   I would keep as same for now  3  HTN  WIll need to follow   4  RA   Followed by Dr Trudie Reed    4  COVID-19 Education: The signs and symptoms of COVID-19 were discussed with the patient and how to seek care for testing (follow up with PCP or arrange E-visit).  The importance of social distancing was discussed today.  Time:   Today, I have spent 15 minutes with the patient with telehealth technology discussing the above problems.     Medication Adjustments/Labs and Tests Ordered: Current medicines are reviewed at length with the patient today.  Concerns regarding medicines are outlined above.   Tests Ordered: No orders of the defined types were placed in this encounter.   Medication Changes: No orders of the defined types were placed in this encounter.   Disposition:  Follow up Feb 2021 Signed, Dorris Carnes, MD  03/20/2019 9:54 AM    [place]

## 2019-03-28 DIAGNOSIS — M0609 Rheumatoid arthritis without rheumatoid factor, multiple sites: Secondary | ICD-10-CM | POA: Diagnosis not present

## 2019-04-25 DIAGNOSIS — M0609 Rheumatoid arthritis without rheumatoid factor, multiple sites: Secondary | ICD-10-CM | POA: Diagnosis not present

## 2019-04-26 DIAGNOSIS — I700 Atherosclerosis of aorta: Secondary | ICD-10-CM | POA: Diagnosis not present

## 2019-04-26 DIAGNOSIS — E663 Overweight: Secondary | ICD-10-CM | POA: Diagnosis not present

## 2019-04-26 DIAGNOSIS — D126 Benign neoplasm of colon, unspecified: Secondary | ICD-10-CM | POA: Diagnosis not present

## 2019-04-26 DIAGNOSIS — Z0001 Encounter for general adult medical examination with abnormal findings: Secondary | ICD-10-CM | POA: Diagnosis not present

## 2019-04-26 DIAGNOSIS — M069 Rheumatoid arthritis, unspecified: Secondary | ICD-10-CM | POA: Diagnosis not present

## 2019-04-26 DIAGNOSIS — Z125 Encounter for screening for malignant neoplasm of prostate: Secondary | ICD-10-CM | POA: Diagnosis not present

## 2019-04-26 DIAGNOSIS — Z6825 Body mass index (BMI) 25.0-25.9, adult: Secondary | ICD-10-CM | POA: Diagnosis not present

## 2019-04-26 DIAGNOSIS — E559 Vitamin D deficiency, unspecified: Secondary | ICD-10-CM | POA: Diagnosis not present

## 2019-04-26 DIAGNOSIS — M1990 Unspecified osteoarthritis, unspecified site: Secondary | ICD-10-CM | POA: Diagnosis not present

## 2019-04-26 DIAGNOSIS — M0609 Rheumatoid arthritis without rheumatoid factor, multiple sites: Secondary | ICD-10-CM | POA: Diagnosis not present

## 2019-04-26 DIAGNOSIS — E782 Mixed hyperlipidemia: Secondary | ICD-10-CM | POA: Diagnosis not present

## 2019-04-26 DIAGNOSIS — I2510 Atherosclerotic heart disease of native coronary artery without angina pectoris: Secondary | ICD-10-CM | POA: Diagnosis not present

## 2019-04-26 DIAGNOSIS — M79641 Pain in right hand: Secondary | ICD-10-CM | POA: Diagnosis not present

## 2019-04-26 DIAGNOSIS — Z79899 Other long term (current) drug therapy: Secondary | ICD-10-CM | POA: Diagnosis not present

## 2019-04-26 DIAGNOSIS — R109 Unspecified abdominal pain: Secondary | ICD-10-CM | POA: Diagnosis not present

## 2019-05-11 DIAGNOSIS — E782 Mixed hyperlipidemia: Secondary | ICD-10-CM | POA: Diagnosis not present

## 2019-05-11 DIAGNOSIS — I2510 Atherosclerotic heart disease of native coronary artery without angina pectoris: Secondary | ICD-10-CM | POA: Diagnosis not present

## 2019-05-11 DIAGNOSIS — M069 Rheumatoid arthritis, unspecified: Secondary | ICD-10-CM | POA: Diagnosis not present

## 2019-05-11 DIAGNOSIS — M0689 Other specified rheumatoid arthritis, multiple sites: Secondary | ICD-10-CM | POA: Diagnosis not present

## 2019-05-11 DIAGNOSIS — M1990 Unspecified osteoarthritis, unspecified site: Secondary | ICD-10-CM | POA: Diagnosis not present

## 2019-05-23 DIAGNOSIS — M0609 Rheumatoid arthritis without rheumatoid factor, multiple sites: Secondary | ICD-10-CM | POA: Diagnosis not present

## 2019-06-21 DIAGNOSIS — M0609 Rheumatoid arthritis without rheumatoid factor, multiple sites: Secondary | ICD-10-CM | POA: Diagnosis not present

## 2019-07-03 DIAGNOSIS — M0689 Other specified rheumatoid arthritis, multiple sites: Secondary | ICD-10-CM | POA: Diagnosis not present

## 2019-07-03 DIAGNOSIS — M069 Rheumatoid arthritis, unspecified: Secondary | ICD-10-CM | POA: Diagnosis not present

## 2019-07-03 DIAGNOSIS — M1990 Unspecified osteoarthritis, unspecified site: Secondary | ICD-10-CM | POA: Diagnosis not present

## 2019-07-03 DIAGNOSIS — I2510 Atherosclerotic heart disease of native coronary artery without angina pectoris: Secondary | ICD-10-CM | POA: Diagnosis not present

## 2019-07-03 DIAGNOSIS — E782 Mixed hyperlipidemia: Secondary | ICD-10-CM | POA: Diagnosis not present

## 2019-07-21 DIAGNOSIS — M0609 Rheumatoid arthritis without rheumatoid factor, multiple sites: Secondary | ICD-10-CM | POA: Diagnosis not present

## 2019-07-24 DIAGNOSIS — Z23 Encounter for immunization: Secondary | ICD-10-CM | POA: Diagnosis not present

## 2019-08-23 DIAGNOSIS — M0609 Rheumatoid arthritis without rheumatoid factor, multiple sites: Secondary | ICD-10-CM | POA: Diagnosis not present

## 2019-09-14 DIAGNOSIS — M545 Low back pain: Secondary | ICD-10-CM | POA: Diagnosis not present

## 2019-09-20 DIAGNOSIS — M0609 Rheumatoid arthritis without rheumatoid factor, multiple sites: Secondary | ICD-10-CM | POA: Diagnosis not present

## 2019-11-01 DIAGNOSIS — M1990 Unspecified osteoarthritis, unspecified site: Secondary | ICD-10-CM | POA: Diagnosis not present

## 2019-11-01 DIAGNOSIS — M0689 Other specified rheumatoid arthritis, multiple sites: Secondary | ICD-10-CM | POA: Diagnosis not present

## 2019-11-01 DIAGNOSIS — E782 Mixed hyperlipidemia: Secondary | ICD-10-CM | POA: Diagnosis not present

## 2019-11-01 DIAGNOSIS — M069 Rheumatoid arthritis, unspecified: Secondary | ICD-10-CM | POA: Diagnosis not present

## 2019-11-01 DIAGNOSIS — I2510 Atherosclerotic heart disease of native coronary artery without angina pectoris: Secondary | ICD-10-CM | POA: Diagnosis not present

## 2019-11-20 DIAGNOSIS — M0609 Rheumatoid arthritis without rheumatoid factor, multiple sites: Secondary | ICD-10-CM | POA: Diagnosis not present

## 2019-11-23 ENCOUNTER — Ambulatory Visit: Payer: Medicare Other | Attending: Internal Medicine

## 2019-11-23 DIAGNOSIS — Z23 Encounter for immunization: Secondary | ICD-10-CM | POA: Insufficient documentation

## 2019-11-23 NOTE — Progress Notes (Signed)
   Covid-19 Vaccination Clinic  Name:  Leroy Hines    MRN: [id number] DOB: Jun 29, 1951  11/23/2019  Leroy Hines was observed post Covid-19 immunization for 15 minutes without incidence. He was provided with Vaccine Information Sheet and instruction to access the V-Safe system.   Leroy Hines was instructed to call 911 with any severe reactions post vaccine: Marland Kitchen Difficulty breathing  . Swelling of your face and throat  . A fast heartbeat  . A bad rash all over your body  . Dizziness and weakness    Immunizations Administered    Name Date Dose VIS Date Route   Pfizer COVID-19 Vaccine 11/23/2019  9:15 AM 0.3 mL 10/13/2019 Intramuscular   Manufacturer: [place]   Lot: [id number]   [place]: [id number]

## 2019-11-25 ENCOUNTER — Telehealth: Payer: Self-pay | Admitting: *Deleted

## 2019-11-25 NOTE — Telephone Encounter (Signed)
Pt calling in regards to changing his 2nd dose of the covid vaccine because it is not scheduled for 21 days out. Unable to change it. Advised to call the [phone] and to call back for any other concerns.

## 2019-12-07 DIAGNOSIS — M0689 Other specified rheumatoid arthritis, multiple sites: Secondary | ICD-10-CM | POA: Diagnosis not present

## 2019-12-07 DIAGNOSIS — E782 Mixed hyperlipidemia: Secondary | ICD-10-CM | POA: Diagnosis not present

## 2019-12-07 DIAGNOSIS — I2510 Atherosclerotic heart disease of native coronary artery without angina pectoris: Secondary | ICD-10-CM | POA: Diagnosis not present

## 2019-12-07 DIAGNOSIS — M1990 Unspecified osteoarthritis, unspecified site: Secondary | ICD-10-CM | POA: Diagnosis not present

## 2019-12-07 DIAGNOSIS — M069 Rheumatoid arthritis, unspecified: Secondary | ICD-10-CM | POA: Diagnosis not present

## 2019-12-13 ENCOUNTER — Ambulatory Visit: Payer: Medicare Other | Attending: Internal Medicine

## 2019-12-13 DIAGNOSIS — Z23 Encounter for immunization: Secondary | ICD-10-CM | POA: Insufficient documentation

## 2019-12-13 NOTE — Progress Notes (Signed)
   Covid-19 Vaccination Clinic  Name:  Leroy Hines    MRN: [id number] DOB: 10/25/1951  12/13/2019  Mr. Lachat was observed post Covid-19 immunization for 15 minutes without incidence. He was provided with Vaccine Information Sheet and instruction to access the V-Safe system.   Mr. Riehm was instructed to call 911 with any severe reactions post vaccine: Marland Kitchen Difficulty breathing  . Swelling of your face and throat  . A fast heartbeat  . A bad rash all over your body  . Dizziness and weakness    Immunizations Administered    Name Date Dose VIS Date Route   Pfizer COVID-19 Vaccine 12/13/2019  9:55 AM 0.3 mL 10/13/2019 Intramuscular   Manufacturer: [place]   Lot: [id number]   [place]: [id number]

## 2019-12-19 DIAGNOSIS — M0609 Rheumatoid arthritis without rheumatoid factor, multiple sites: Secondary | ICD-10-CM | POA: Diagnosis not present

## 2020-01-09 DIAGNOSIS — M1990 Unspecified osteoarthritis, unspecified site: Secondary | ICD-10-CM | POA: Diagnosis not present

## 2020-01-09 DIAGNOSIS — E782 Mixed hyperlipidemia: Secondary | ICD-10-CM | POA: Diagnosis not present

## 2020-01-09 DIAGNOSIS — M0689 Other specified rheumatoid arthritis, multiple sites: Secondary | ICD-10-CM | POA: Diagnosis not present

## 2020-01-09 DIAGNOSIS — M069 Rheumatoid arthritis, unspecified: Secondary | ICD-10-CM | POA: Diagnosis not present

## 2020-01-16 DIAGNOSIS — M0609 Rheumatoid arthritis without rheumatoid factor, multiple sites: Secondary | ICD-10-CM | POA: Diagnosis not present

## 2020-01-30 DIAGNOSIS — L570 Actinic keratosis: Secondary | ICD-10-CM | POA: Diagnosis not present

## 2020-01-30 DIAGNOSIS — D692 Other nonthrombocytopenic purpura: Secondary | ICD-10-CM | POA: Diagnosis not present

## 2020-01-30 DIAGNOSIS — L821 Other seborrheic keratosis: Secondary | ICD-10-CM | POA: Diagnosis not present

## 2020-01-30 DIAGNOSIS — D485 Neoplasm of uncertain behavior of skin: Secondary | ICD-10-CM | POA: Diagnosis not present

## 2020-01-30 DIAGNOSIS — L814 Other melanin hyperpigmentation: Secondary | ICD-10-CM | POA: Diagnosis not present

## 2020-01-30 DIAGNOSIS — D1801 Hemangioma of skin and subcutaneous tissue: Secondary | ICD-10-CM | POA: Diagnosis not present

## 2020-01-30 DIAGNOSIS — Z85828 Personal history of other malignant neoplasm of skin: Secondary | ICD-10-CM | POA: Diagnosis not present

## 2020-01-30 DIAGNOSIS — L820 Inflamed seborrheic keratosis: Secondary | ICD-10-CM | POA: Diagnosis not present

## 2020-02-12 DIAGNOSIS — M1990 Unspecified osteoarthritis, unspecified site: Secondary | ICD-10-CM | POA: Diagnosis not present

## 2020-02-12 DIAGNOSIS — M069 Rheumatoid arthritis, unspecified: Secondary | ICD-10-CM | POA: Diagnosis not present

## 2020-02-12 DIAGNOSIS — E782 Mixed hyperlipidemia: Secondary | ICD-10-CM | POA: Diagnosis not present

## 2020-02-12 DIAGNOSIS — I2510 Atherosclerotic heart disease of native coronary artery without angina pectoris: Secondary | ICD-10-CM | POA: Diagnosis not present

## 2020-02-12 DIAGNOSIS — M0689 Other specified rheumatoid arthritis, multiple sites: Secondary | ICD-10-CM | POA: Diagnosis not present

## 2020-02-13 DIAGNOSIS — M0609 Rheumatoid arthritis without rheumatoid factor, multiple sites: Secondary | ICD-10-CM | POA: Diagnosis not present

## 2020-02-13 DIAGNOSIS — Z79899 Other long term (current) drug therapy: Secondary | ICD-10-CM | POA: Diagnosis not present

## 2020-03-13 DIAGNOSIS — M0609 Rheumatoid arthritis without rheumatoid factor, multiple sites: Secondary | ICD-10-CM | POA: Diagnosis not present

## 2020-03-25 DIAGNOSIS — Z0000 Encounter for general adult medical examination without abnormal findings: Secondary | ICD-10-CM | POA: Diagnosis not present

## 2020-03-25 DIAGNOSIS — E559 Vitamin D deficiency, unspecified: Secondary | ICD-10-CM | POA: Diagnosis not present

## 2020-03-25 DIAGNOSIS — E782 Mixed hyperlipidemia: Secondary | ICD-10-CM | POA: Diagnosis not present

## 2020-03-25 DIAGNOSIS — Z125 Encounter for screening for malignant neoplasm of prostate: Secondary | ICD-10-CM | POA: Diagnosis not present

## 2020-03-25 DIAGNOSIS — I2510 Atherosclerotic heart disease of native coronary artery without angina pectoris: Secondary | ICD-10-CM | POA: Diagnosis not present

## 2020-03-27 DIAGNOSIS — M1990 Unspecified osteoarthritis, unspecified site: Secondary | ICD-10-CM | POA: Diagnosis not present

## 2020-03-27 DIAGNOSIS — M0689 Other specified rheumatoid arthritis, multiple sites: Secondary | ICD-10-CM | POA: Diagnosis not present

## 2020-03-27 DIAGNOSIS — I2510 Atherosclerotic heart disease of native coronary artery without angina pectoris: Secondary | ICD-10-CM | POA: Diagnosis not present

## 2020-03-27 DIAGNOSIS — M069 Rheumatoid arthritis, unspecified: Secondary | ICD-10-CM | POA: Diagnosis not present

## 2020-03-27 DIAGNOSIS — E782 Mixed hyperlipidemia: Secondary | ICD-10-CM | POA: Diagnosis not present

## 2020-04-04 ENCOUNTER — Encounter: Payer: Self-pay | Admitting: Internal Medicine

## 2020-04-04 ENCOUNTER — Other Ambulatory Visit: Payer: Self-pay

## 2020-04-04 ENCOUNTER — Ambulatory Visit (INDEPENDENT_AMBULATORY_CARE_PROVIDER_SITE_OTHER): Payer: Medicare Other | Admitting: Internal Medicine

## 2020-04-04 VITALS — BP 138/80 | HR 70 | Ht 70.0 in | Wt 182.0 lb

## 2020-04-04 DIAGNOSIS — I10 Essential (primary) hypertension: Secondary | ICD-10-CM

## 2020-04-04 DIAGNOSIS — E785 Hyperlipidemia, unspecified: Secondary | ICD-10-CM | POA: Diagnosis not present

## 2020-04-04 DIAGNOSIS — I2510 Atherosclerotic heart disease of native coronary artery without angina pectoris: Secondary | ICD-10-CM | POA: Diagnosis not present

## 2020-04-04 NOTE — Progress Notes (Signed)
Cardiology Office Note   Date:  04/04/2020   ID:  CREW OCANA, DOB [birth year], MRN [id number]  PCP:  Josetta Huddle, MD  Cardiologist:   Dorris Carnes, MD   F/u of CAD and blood pressure     History of Present Illness: Leroy Hines is a 69 y.o. male with a history of HTN and  coronary calcifications, CP in past   Stress test in 2016   Pt declinied statin    Since seen in a televisit in May 2020, the pt has done well   He remains very active   Golfs regularly    Denies CP   Breathing is good     Weight is down   He has cut out carbs      Current Meds  Medication Sig  . aspirin EC 81 MG tablet Take 81 mg by mouth daily.  Marland Kitchen CALCIUM-MAGNESIUM-ZINC PO Take by mouth daily.  . Certolizumab Pegol (CIMZIA [place]) Inject into the skin every 30 (thirty) days.  . Coenzyme Q10 (CO Q10 PO) Take by mouth.  . multivitamin-iron-minerals-folic acid (CENTRUM) chewable tablet Chew 1 tablet by mouth daily.  . Omega-3 Fatty Acids (FISH OIL PO) Take 1,000 mg by mouth daily.  . Red Yeast Rice 600 MG CAPS Take 600 capsules by mouth 2 (two) times a day.  . sildenafil (REVATIO) 20 MG tablet Take 20 mg by mouth as directed.  . TURMERIC PO Take 500 mg by mouth daily.   . vitamin C (ASCORBIC ACID) 500 MG tablet Take 500 mg by mouth daily.  . Vitamin D, Cholecalciferol, 1000 UNITS TABS Take 1,000 mg by mouth daily.     Allergies:   Celecoxib and Sulfamethoxazole-trimethoprim   Past Medical History:  Diagnosis Date  . Arthritis   . Rheumatoid arthritis ([place])   . Sports hernia     Past Surgical History:  Procedure Laterality Date  . SHOULDER SURGERY     right and left.     Social History:  The patient  reports that he has never smoked. He has never used smokeless tobacco. He reports current alcohol use of about 24.0 standard drinks of alcohol per week. He reports that he does not use drugs.   Family History:  The patient's family history includes Cancer in his father and mother; Healthy  in his brother; Heart disease in his mother.    ROS:  Please see the history of present illness. All other systems are reviewed and  Negative to the above problem except as noted.    PHYSICAL EXAM: VS:  BP 138/80   Pulse 70   Ht 5\' 10"  (1.778 m)   Wt 182 lb (82.6 kg)   SpO2 99%   BMI 26.11 kg/m   GEN: Well nourished 69 yo in no acute distress  HEENT: normal  Neck: no JVD, carotid bruits Cardiac: RRR; no murmurs, rubs, or gallops,no edema  Respiratory:  clear to auscultation bilaterally, normal work of breathing GI: soft, nontender, nondistended, + BS  No hepatomegaly  MS: no deformity Moving all extremities   Skin: warm and dry, no rash Neuro:  Strength and sensation are intact Psych: euthymic mood, full affect   EKG:  EKG is not ordered today.   Lipid Panel    Component Value Date/Time   CHOL 159 01/09/2019 0948   CHOL 166 12/10/2017 1052   TRIG 83 01/09/2019 0948   HDL 58 01/09/2019 0948   HDL 52 12/10/2017 1052   CHOLHDL 2.7 01/09/2019  0948   VLDL 17 01/09/2019 0948   LDLCALC 84 01/09/2019 0948   LDLCALC 101 (H) 12/10/2017 1052    LIpids from Mar 25, 2020:   LDL 89   HDL 68  Tri 52  Total 168    Wt Readings from Last 3 Encounters:  04/04/20 182 lb (82.6 kg)  03/20/19 182 lb (82.6 kg)  12/10/17 196 lb (88.9 kg)      ASSESSMENT AND PLAN:  1  Hx coronary calcifications    No symptoms of angina   Follow risk factors LDL is improved     Stay active  2  Blood pressure   BP 138/   Continue to follow   Goal 110s to low 130s/  Stay active   Keep with diet    F/U in 1 year      Current medicines are reviewed at length with the patient today.  The patient does not have concerns regarding medicines.  Signed, Dorris Carnes, MD  04/04/2020 10:02 AM    [place] [address], [place], [place]  [address] Phone: [phone]; Fax: [phone]

## 2020-04-04 NOTE — Patient Instructions (Signed)
Medication Instructions:  Your physician recommends that you continue on your current medications as directed. Please refer to the Current Medication list given to you today.  *If you need a refill on your cardiac medications before your next appointment, please call your pharmacy*   Lab Work: NONE  If you have labs (blood work) drawn today and your tests are completely normal, you will receive your results only by: MyChart Message (if you have MyChart) OR A paper copy in the mail If you have any lab test that is abnormal or we need to change your treatment, we will call you to review the results.   Testing/Procedures: NONE    Follow-Up: At [facility], you and your health needs are our priority.  As part of our continuing mission to provide you with exceptional heart care, we have created designated Provider Care Teams.  These Care Teams include your primary Cardiologist (physician) and Advanced Practice Providers (APPs -  Physician Assistants and Nurse Practitioners) who all work together to provide you with the care you need, when you need it.  We recommend signing up for the patient portal called "MyChart".  Sign up information is provided on this After Visit Summary.  MyChart is used to connect with patients for Virtual Visits (Telemedicine).  Patients are able to view lab/test results, encounter notes, upcoming appointments, etc.  Non-urgent messages can be sent to your provider as well.   To learn more about what you can do with MyChart, go to [email or web address].    Your next appointment:   1 year(s)  The format for your next appointment:   In Person  Provider:   Paula Ross, MD   Other Instructions Thank you for choosing [facility]!    

## 2020-04-10 DIAGNOSIS — M0609 Rheumatoid arthritis without rheumatoid factor, multiple sites: Secondary | ICD-10-CM | POA: Diagnosis not present

## 2020-04-18 DIAGNOSIS — Z6825 Body mass index (BMI) 25.0-25.9, adult: Secondary | ICD-10-CM | POA: Diagnosis not present

## 2020-04-18 DIAGNOSIS — Z79899 Other long term (current) drug therapy: Secondary | ICD-10-CM | POA: Diagnosis not present

## 2020-04-18 DIAGNOSIS — M0609 Rheumatoid arthritis without rheumatoid factor, multiple sites: Secondary | ICD-10-CM | POA: Diagnosis not present

## 2020-04-18 DIAGNOSIS — M79641 Pain in right hand: Secondary | ICD-10-CM | POA: Diagnosis not present

## 2020-04-18 DIAGNOSIS — E663 Overweight: Secondary | ICD-10-CM | POA: Diagnosis not present

## 2020-06-01 DIAGNOSIS — M1990 Unspecified osteoarthritis, unspecified site: Secondary | ICD-10-CM | POA: Diagnosis not present

## 2020-06-01 DIAGNOSIS — I2510 Atherosclerotic heart disease of native coronary artery without angina pectoris: Secondary | ICD-10-CM | POA: Diagnosis not present

## 2020-06-01 DIAGNOSIS — E782 Mixed hyperlipidemia: Secondary | ICD-10-CM | POA: Diagnosis not present

## 2020-06-01 DIAGNOSIS — M069 Rheumatoid arthritis, unspecified: Secondary | ICD-10-CM | POA: Diagnosis not present

## 2020-06-01 DIAGNOSIS — M0689 Other specified rheumatoid arthritis, multiple sites: Secondary | ICD-10-CM | POA: Diagnosis not present

## 2020-06-05 DIAGNOSIS — M0609 Rheumatoid arthritis without rheumatoid factor, multiple sites: Secondary | ICD-10-CM | POA: Diagnosis not present

## 2020-07-01 DIAGNOSIS — E782 Mixed hyperlipidemia: Secondary | ICD-10-CM | POA: Diagnosis not present

## 2020-07-01 DIAGNOSIS — M0689 Other specified rheumatoid arthritis, multiple sites: Secondary | ICD-10-CM | POA: Diagnosis not present

## 2020-07-01 DIAGNOSIS — M069 Rheumatoid arthritis, unspecified: Secondary | ICD-10-CM | POA: Diagnosis not present

## 2020-07-01 DIAGNOSIS — M1990 Unspecified osteoarthritis, unspecified site: Secondary | ICD-10-CM | POA: Diagnosis not present

## 2020-07-01 DIAGNOSIS — I2510 Atherosclerotic heart disease of native coronary artery without angina pectoris: Secondary | ICD-10-CM | POA: Diagnosis not present

## 2020-07-04 DIAGNOSIS — M0609 Rheumatoid arthritis without rheumatoid factor, multiple sites: Secondary | ICD-10-CM | POA: Diagnosis not present

## 2020-07-11 ENCOUNTER — Ambulatory Visit: Payer: Medicare Other | Attending: Internal Medicine

## 2020-07-11 DIAGNOSIS — Z23 Encounter for immunization: Secondary | ICD-10-CM

## 2020-07-11 NOTE — Progress Notes (Signed)
   Covid-19 Vaccination Clinic  Name:  TIMTOHY BROSKI    MRN: [id number] DOB: 1951/09/22  07/11/2020  Mr. Sagan was observed post Covid-19 immunization for 15 minutes without incident. He was provided with Vaccine Information Sheet and instruction to access the V-Safe system.   Mr. Churilla was instructed to call 911 with any severe reactions post vaccine: Marland Kitchen Difficulty breathing  . Swelling of face and throat  . A fast heartbeat  . A bad rash all over body  . Dizziness and weakness

## 2020-07-29 DIAGNOSIS — I2510 Atherosclerotic heart disease of native coronary artery without angina pectoris: Secondary | ICD-10-CM | POA: Diagnosis not present

## 2020-07-29 DIAGNOSIS — M1990 Unspecified osteoarthritis, unspecified site: Secondary | ICD-10-CM | POA: Diagnosis not present

## 2020-07-29 DIAGNOSIS — M069 Rheumatoid arthritis, unspecified: Secondary | ICD-10-CM | POA: Diagnosis not present

## 2020-07-29 DIAGNOSIS — E782 Mixed hyperlipidemia: Secondary | ICD-10-CM | POA: Diagnosis not present

## 2020-07-29 DIAGNOSIS — M0689 Other specified rheumatoid arthritis, multiple sites: Secondary | ICD-10-CM | POA: Diagnosis not present

## 2020-08-01 DIAGNOSIS — Z79899 Other long term (current) drug therapy: Secondary | ICD-10-CM | POA: Diagnosis not present

## 2020-08-01 DIAGNOSIS — M0609 Rheumatoid arthritis without rheumatoid factor, multiple sites: Secondary | ICD-10-CM | POA: Diagnosis not present

## 2020-08-20 DIAGNOSIS — Z23 Encounter for immunization: Secondary | ICD-10-CM | POA: Diagnosis not present

## 2020-08-29 DIAGNOSIS — E782 Mixed hyperlipidemia: Secondary | ICD-10-CM | POA: Diagnosis not present

## 2020-08-29 DIAGNOSIS — M069 Rheumatoid arthritis, unspecified: Secondary | ICD-10-CM | POA: Diagnosis not present

## 2020-08-29 DIAGNOSIS — M1990 Unspecified osteoarthritis, unspecified site: Secondary | ICD-10-CM | POA: Diagnosis not present

## 2020-08-29 DIAGNOSIS — M0609 Rheumatoid arthritis without rheumatoid factor, multiple sites: Secondary | ICD-10-CM | POA: Diagnosis not present

## 2020-08-29 DIAGNOSIS — I2510 Atherosclerotic heart disease of native coronary artery without angina pectoris: Secondary | ICD-10-CM | POA: Diagnosis not present

## 2020-08-29 DIAGNOSIS — M0689 Other specified rheumatoid arthritis, multiple sites: Secondary | ICD-10-CM | POA: Diagnosis not present

## 2020-10-01 DIAGNOSIS — M0609 Rheumatoid arthritis without rheumatoid factor, multiple sites: Secondary | ICD-10-CM | POA: Diagnosis not present

## 2020-10-23 DIAGNOSIS — E782 Mixed hyperlipidemia: Secondary | ICD-10-CM | POA: Diagnosis not present

## 2020-10-23 DIAGNOSIS — I2510 Atherosclerotic heart disease of native coronary artery without angina pectoris: Secondary | ICD-10-CM | POA: Diagnosis not present

## 2020-10-23 DIAGNOSIS — M0689 Other specified rheumatoid arthritis, multiple sites: Secondary | ICD-10-CM | POA: Diagnosis not present

## 2020-10-23 DIAGNOSIS — M069 Rheumatoid arthritis, unspecified: Secondary | ICD-10-CM | POA: Diagnosis not present

## 2020-10-23 DIAGNOSIS — M1990 Unspecified osteoarthritis, unspecified site: Secondary | ICD-10-CM | POA: Diagnosis not present

## 2020-10-29 DIAGNOSIS — M0609 Rheumatoid arthritis without rheumatoid factor, multiple sites: Secondary | ICD-10-CM | POA: Diagnosis not present

## 2020-11-12 DIAGNOSIS — Z6826 Body mass index (BMI) 26.0-26.9, adult: Secondary | ICD-10-CM | POA: Diagnosis not present

## 2020-11-12 DIAGNOSIS — M720 Palmar fascial fibromatosis [Dupuytren]: Secondary | ICD-10-CM | POA: Diagnosis not present

## 2020-11-12 DIAGNOSIS — M79641 Pain in right hand: Secondary | ICD-10-CM | POA: Diagnosis not present

## 2020-11-12 DIAGNOSIS — Z79899 Other long term (current) drug therapy: Secondary | ICD-10-CM | POA: Diagnosis not present

## 2020-11-12 DIAGNOSIS — M0609 Rheumatoid arthritis without rheumatoid factor, multiple sites: Secondary | ICD-10-CM | POA: Diagnosis not present

## 2020-11-12 DIAGNOSIS — E663 Overweight: Secondary | ICD-10-CM | POA: Diagnosis not present

## 2020-11-21 DIAGNOSIS — M13849 Other specified arthritis, unspecified hand: Secondary | ICD-10-CM | POA: Diagnosis not present

## 2020-11-21 DIAGNOSIS — M720 Palmar fascial fibromatosis [Dupuytren]: Secondary | ICD-10-CM | POA: Diagnosis not present

## 2020-11-26 DIAGNOSIS — M0609 Rheumatoid arthritis without rheumatoid factor, multiple sites: Secondary | ICD-10-CM | POA: Diagnosis not present

## 2020-11-28 DIAGNOSIS — M069 Rheumatoid arthritis, unspecified: Secondary | ICD-10-CM | POA: Diagnosis not present

## 2020-11-28 DIAGNOSIS — L814 Other melanin hyperpigmentation: Secondary | ICD-10-CM | POA: Diagnosis not present

## 2020-11-28 DIAGNOSIS — E782 Mixed hyperlipidemia: Secondary | ICD-10-CM | POA: Diagnosis not present

## 2020-11-28 DIAGNOSIS — D1801 Hemangioma of skin and subcutaneous tissue: Secondary | ICD-10-CM | POA: Diagnosis not present

## 2020-11-28 DIAGNOSIS — M0689 Other specified rheumatoid arthritis, multiple sites: Secondary | ICD-10-CM | POA: Diagnosis not present

## 2020-11-28 DIAGNOSIS — L570 Actinic keratosis: Secondary | ICD-10-CM | POA: Diagnosis not present

## 2020-11-28 DIAGNOSIS — Z85828 Personal history of other malignant neoplasm of skin: Secondary | ICD-10-CM | POA: Diagnosis not present

## 2020-11-28 DIAGNOSIS — M720 Palmar fascial fibromatosis [Dupuytren]: Secondary | ICD-10-CM | POA: Diagnosis not present

## 2020-11-28 DIAGNOSIS — M1990 Unspecified osteoarthritis, unspecified site: Secondary | ICD-10-CM | POA: Diagnosis not present

## 2020-11-28 DIAGNOSIS — D485 Neoplasm of uncertain behavior of skin: Secondary | ICD-10-CM | POA: Diagnosis not present

## 2020-11-28 DIAGNOSIS — D225 Melanocytic nevi of trunk: Secondary | ICD-10-CM | POA: Diagnosis not present

## 2020-11-28 DIAGNOSIS — I2510 Atherosclerotic heart disease of native coronary artery without angina pectoris: Secondary | ICD-10-CM | POA: Diagnosis not present

## 2020-11-28 DIAGNOSIS — L821 Other seborrheic keratosis: Secondary | ICD-10-CM | POA: Diagnosis not present

## 2020-12-30 DIAGNOSIS — M0609 Rheumatoid arthritis without rheumatoid factor, multiple sites: Secondary | ICD-10-CM | POA: Diagnosis not present

## 2020-12-30 DIAGNOSIS — Z79899 Other long term (current) drug therapy: Secondary | ICD-10-CM | POA: Diagnosis not present

## 2020-12-30 DIAGNOSIS — Z13220 Encounter for screening for lipoid disorders: Secondary | ICD-10-CM | POA: Diagnosis not present

## 2021-01-27 DIAGNOSIS — Z79899 Other long term (current) drug therapy: Secondary | ICD-10-CM | POA: Diagnosis not present

## 2021-01-27 DIAGNOSIS — M0609 Rheumatoid arthritis without rheumatoid factor, multiple sites: Secondary | ICD-10-CM | POA: Diagnosis not present

## 2021-01-27 DIAGNOSIS — M1990 Unspecified osteoarthritis, unspecified site: Secondary | ICD-10-CM | POA: Diagnosis not present

## 2021-01-27 DIAGNOSIS — M0689 Other specified rheumatoid arthritis, multiple sites: Secondary | ICD-10-CM | POA: Diagnosis not present

## 2021-01-27 DIAGNOSIS — E782 Mixed hyperlipidemia: Secondary | ICD-10-CM | POA: Diagnosis not present

## 2021-01-27 DIAGNOSIS — I2510 Atherosclerotic heart disease of native coronary artery without angina pectoris: Secondary | ICD-10-CM | POA: Diagnosis not present

## 2021-01-27 DIAGNOSIS — M069 Rheumatoid arthritis, unspecified: Secondary | ICD-10-CM | POA: Diagnosis not present

## 2021-02-25 DIAGNOSIS — M0609 Rheumatoid arthritis without rheumatoid factor, multiple sites: Secondary | ICD-10-CM | POA: Diagnosis not present

## 2021-03-04 ENCOUNTER — Other Ambulatory Visit: Payer: Self-pay | Admitting: Internal Medicine

## 2021-03-04 ENCOUNTER — Ambulatory Visit
Admission: RE | Admit: 2021-03-04 | Discharge: 2021-03-04 | Disposition: A | Discharge status: Home / Self Care | Payer: Medicare Other | Source: Ambulatory Visit | Attending: Internal Medicine | Admitting: Internal Medicine

## 2021-03-04 DIAGNOSIS — M545 Low back pain, unspecified: Secondary | ICD-10-CM

## 2021-03-07 DIAGNOSIS — Z23 Encounter for immunization: Secondary | ICD-10-CM | POA: Diagnosis not present

## 2021-03-11 ENCOUNTER — Ambulatory Visit (HOSPITAL_COMMUNITY): Payer: Medicare Other | Attending: Internal Medicine | Admitting: Physical Therapy

## 2021-03-11 ENCOUNTER — Encounter (HOSPITAL_COMMUNITY): Payer: Self-pay | Admitting: Physical Therapy

## 2021-03-11 ENCOUNTER — Other Ambulatory Visit: Payer: Self-pay

## 2021-03-11 DIAGNOSIS — R29898 Other symptoms and signs involving the musculoskeletal system: Secondary | ICD-10-CM

## 2021-03-11 DIAGNOSIS — M6281 Muscle weakness (generalized): Secondary | ICD-10-CM

## 2021-03-11 DIAGNOSIS — M545 Low back pain, unspecified: Secondary | ICD-10-CM

## 2021-03-11 DIAGNOSIS — R2689 Other abnormalities of gait and mobility: Secondary | ICD-10-CM | POA: Diagnosis not present

## 2021-03-11 NOTE — Patient Instructions (Signed)
Access Code: [email or web address] URL: [email or web address] Date: 03/11/2021 Prepared by: Mitzi Hansen Shaquil Aldana  Exercises Supine Bridge - 1-2 x daily - 7 x weekly - 2 sets - 10 reps Prone Hip Extension - 1-2 x daily - 7 x weekly - 2 sets - 10 reps

## 2021-03-11 NOTE — Therapy (Signed)
[place], [place], [address] Phone: [phone]   Fax:  [phone]  Physical Therapy Evaluation  Patient Details  Name: Leroy Hines MRN: [id number] Date of Birth: [birth year] Referring Provider (PT): Josetta Huddle MD   Encounter Date: 03/11/2021   PT End of Session - 03/11/21 1353    Visit Number 1    Number of Visits 8    Date for PT Re-Evaluation 04/08/21    Authorization Type Primary Medicare Secondary BCBS (follows MCR guidlines)    PT Start Time 1315    PT Stop Time 1352    PT Time Calculation (min) 37 min    Activity Tolerance Patient tolerated treatment well    Behavior During Therapy [facility] for tasks assessed/performed           Past Medical History:  Diagnosis Date  . Arthritis   . Rheumatoid arthritis ([place])   . Sports hernia     Past Surgical History:  Procedure Laterality Date  . SHOULDER SURGERY     right and left.    There were no vitals filed for this visit.    Subjective Assessment - 03/11/21 1311    Subjective Patient is a 70 y.o. male who presents to physical therapy with c/o low back pain. Patient states symptoms have began about 2 weeks ago with insidious onset. He has had pain before but it usually goes away really quick. He started prednisone taper which is helping. He has been gradually getting better. He had a issue with it before in November 2020. He golfs about 200 rounds a year. Patient states pain every once and a while and it catches with movement. He is also taking muscle relaxer. His main goal is to get back to playing golf pain free.    Limitations Lifting;House hold activities    Currently in Pain? No/denies              [facility] PT Assessment - 03/11/21 0001      Assessment   Medical Diagnosis LBP    Referring Provider (PT) Josetta Huddle MD    Onset Date/Surgical Date 02/23/21    Next MD Visit none scheduled    Prior Therapy for shoulder      Precautions    Precautions None      Restrictions   Weight Bearing Restrictions No      Balance Screen   Has the patient fallen in the past 6 months No    Has the patient had a decrease in activity level because of a fear of falling?  No    Is the patient reluctant to leave their home because of a fear of falling?  No      Prior Function   Level of Independence Independent    Vocation Self employed    Tax inspector    Leisure Golf      Cognition   Overall Cognitive Status Within Functional Limits for tasks assessed      Observation/Other Assessments   Observations Ambulates without AD    Focus on Therapeutic Outcomes (FOTO)  67% function      Sensation   Light Touch Appears Intact      Posture/Postural Control   Posture/Postural Control Postural limitations    Postural Limitations Rounded Shoulders;Forward head;Decreased lumbar lordosis      ROM / Strength   AROM / PROM / Strength AROM;Strength      AROM   AROM Assessment Site Lumbar  Lumbar Flexion 25% limited    Lumbar Extension 50% limited    Lumbar - Right Side Bend 25% limited    Lumbar - Left Side Bend 25% limited    Lumbar - Right Rotation 0% limited    Lumbar - Left Rotation 0% limited      Strength   Strength Assessment Site Hip;Knee;Ankle    Right/Left Hip Right;Left    Right Hip Flexion 4+/5    Right Hip Extension 3+/5    Right Hip ABduction 4/5    Left Hip Flexion 4+/5    Left Hip Extension 3+/5    Left Hip ABduction 4/5    Right/Left Knee Right;Left    Right Knee Flexion 5/5    Right Knee Extension 5/5    Left Knee Flexion 5/5    Left Knee Extension 5/5    Right/Left Ankle Right;Left    Right Ankle Dorsiflexion 5/5    Left Ankle Dorsiflexion 5/5      Palpation   Spinal mobility hypomobile thoracic and lumbar spine    Palpation comment no TTP throughout spine, decreased lumbar lordosis                      Objective measurements completed on examination: See above findings.        [facility] Adult PT Treatment/Exercise - 03/11/21 0001      Exercises   Exercises Lumbar      Lumbar Exercises: Supine   Bridge 10 reps    Bridge Limitations with cueing for glute set      Lumbar Exercises: Prone   Straight Leg Raise 10 reps    Straight Leg Raises Limitations bilateral                  PT Education - 03/11/21 1310    Education Details Patient educated on exam findings, POC, scope of PT, HEP    Person(s) Educated Patient    Methods Explanation;Demonstration;Handout    Comprehension Verbalized understanding;Returned demonstration            PT Short Term Goals - 03/11/21 1358      PT SHORT TERM GOAL #1   Title Patient will be independent with HEP in order to improve functional outcomes.    Time 2    Period Weeks    Status New    Target Date 03/25/21      PT SHORT TERM GOAL #2   Title Patient will report at least 25% improvement in symptoms for improved quality of life.    Time 2    Period Weeks    Status New    Target Date 03/25/21             PT Long Term Goals - 03/11/21 1358      PT LONG TERM GOAL #1   Title Patient will report at least 75% improvement in symptoms for improved quality of life.    Time 4    Period Weeks    Status New    Target Date 04/08/21      PT LONG TERM GOAL #2   Title Patient will improve FOTO score by at least 10 points in order to indicate improved tolerance to activity.    Time 4    Period Weeks    Status New    Target Date 04/08/21                  Plan - 03/11/21 1355    Clinical  Impression Statement Patient is a 70 y.o. male who presents to physical therapy with c/o low back pain.  He presents with pain limited deficits in lumbar strength, ROM, endurance, postural impairments, spinal mobility and functional mobility with ADL. He is having to modify and restrict ADL as indicated by FOTO score as well as subjective information and objective measures which is affecting overall  participation. Patient will benefit from skilled physical therapy in order to improve function and reduce impairment.    Personal Factors and Comorbidities Comorbidity 1;Fitness    Comorbidities hx back pain    Examination-Activity Limitations Other;Lift;Bend   golf   Examination-Participation Restrictions Yard Work;Community Activity;Other   golf   Stability/Clinical Decision Making Stable/Uncomplicated    Clinical Decision Making Low    Rehab Potential Good    PT Frequency 2x / week    PT Duration 4 weeks    PT Treatment/Interventions ADLs/Self Care Home Management;Aquatic Therapy;Cryotherapy;Electrical Stimulation;Iontophoresis 4mg /ml Dexamethasone;Moist Heat;Traction;Ultrasound;DME Instruction;Gait training;Stair training;Functional mobility training;Therapeutic activities;Therapeutic exercise;Balance training;Neuromuscular re-education;Patient/family education;Dry needling;Energy conservation;Manual techniques;Passive range of motion;Splinting;Taping;Spinal Manipulations;Joint Manipulations    PT Next Visit Plan hip abductor/extensor strength, core strength, lumbar mobility, possibly chops/lifts    PT Home Exercise Plan 5/10 bridge, hip ext    Consulted and Agree with Plan of Care Patient           Patient will benefit from skilled therapeutic intervention in order to improve the following deficits and impairments:  Decreased range of motion,Hypomobility,Decreased mobility,Decreased strength,Postural dysfunction,Pain,Improper body mechanics  Visit Diagnosis: Low back pain, unspecified back pain laterality, unspecified chronicity, unspecified whether sciatica present  Muscle weakness (generalized)  Other abnormalities of gait and mobility  Other symptoms and signs involving the musculoskeletal system     Problem List Patient Active Problem List   Diagnosis Date Noted  . Chest pain 07/15/2015  . Palpitations 07/15/2015  . Hyperlipidemia 07/15/2015  . OSTEOMYELITIS, CHRONIC  08/05/2004    1:59 PM, 03/11/21 Mearl Latin PT, DPT Physical Therapist at [place], [place], [address] Phone: [phone]   Fax:  [phone]  Name: Leroy Hines MRN: [id number] Date of Birth: Oct 14, 1951

## 2021-03-12 ENCOUNTER — Ambulatory Visit (HOSPITAL_COMMUNITY): Payer: Medicare Other | Admitting: Physical Therapy

## 2021-03-12 ENCOUNTER — Encounter (HOSPITAL_COMMUNITY): Payer: Self-pay | Admitting: Physical Therapy

## 2021-03-12 DIAGNOSIS — M6281 Muscle weakness (generalized): Secondary | ICD-10-CM | POA: Diagnosis not present

## 2021-03-12 DIAGNOSIS — R2689 Other abnormalities of gait and mobility: Secondary | ICD-10-CM | POA: Diagnosis not present

## 2021-03-12 DIAGNOSIS — R29898 Other symptoms and signs involving the musculoskeletal system: Secondary | ICD-10-CM | POA: Diagnosis not present

## 2021-03-12 DIAGNOSIS — M545 Low back pain, unspecified: Secondary | ICD-10-CM | POA: Diagnosis not present

## 2021-03-12 NOTE — Patient Instructions (Signed)
Access Code: [email or web address] URL: [email or web address] Date: 03/12/2021 Prepared by: Mitzi Hansen Mckensi Redinger  Exercises Sidelying Hip Abduction - 1 x daily - 7 x weekly - 2 sets - 15 reps Standing Anti-Rotation Press with Anchored Resistance - 1 x daily - 7 x weekly - 2 sets - 10 reps Standing Hip Hinge with Dowel - 1 x daily - 7 x weekly - 2 sets - 10 reps

## 2021-03-12 NOTE — Therapy (Signed)
[place], [place], [address] Phone: [phone]   Fax:  [phone]  Physical Therapy Treatment  Patient Details  Name: Leroy Hines MRN: [id number] Date of Birth: 05-11-1951 Referring Provider (PT): Josetta Huddle MD   Encounter Date: 03/12/2021   PT End of Session - 03/12/21 1127    Visit Number 2    Number of Visits 8    Date for PT Re-Evaluation 04/08/21    Authorization Type Primary Medicare Secondary BCBS (follows MCR guidlines)    PT Start Time 1128    PT Stop Time 1208    PT Time Calculation (min) 40 min    Activity Tolerance Patient tolerated treatment well    Behavior During Therapy [facility] for tasks assessed/performed           Past Medical History:  Diagnosis Date  . Arthritis   . Rheumatoid arthritis ([place])   . Sports hernia     Past Surgical History:  Procedure Laterality Date  . SHOULDER SURGERY     right and left.    There were no vitals filed for this visit.   Subjective Assessment - 03/12/21 1128    Subjective Patient states his exercises went alright. He did go golf yesterday and his back was alright. Mostly painfree but not totally.    Limitations Lifting;House hold activities    Currently in Pain? No/denies                             [facility] Adult PT Treatment/Exercise - 03/12/21 0001      Lumbar Exercises: Standing   Other Standing Lumbar Exercises hip hinge 2x 15 with dowel    Other Standing Lumbar Exercises palof press 2x 10 bilateral green band, lifts 1x 10 bilateral green band, chops 1x 10 bilateral green band      Lumbar Exercises: Supine   Bridge 10 reps    Bridge Limitations with cueing for glute set      Lumbar Exercises: Sidelying   Hip Abduction Both;15 reps;3 seconds      Lumbar Exercises: Prone   Straight Leg Raise 10 reps    Straight Leg Raises Limitations bilateral                  PT Education - 03/12/21 1128    Education  Details HEP, exercise mechanics    Person(s) Educated Patient    Methods Explanation;Demonstration    Comprehension Verbalized understanding;Returned demonstration            PT Short Term Goals - 03/11/21 1358      PT SHORT TERM GOAL #1   Title Patient will be independent with HEP in order to improve functional outcomes.    Time 2    Period Weeks    Status New    Target Date 03/25/21      PT SHORT TERM GOAL #2   Title Patient will report at least 25% improvement in symptoms for improved quality of life.    Time 2    Period Weeks    Status New    Target Date 03/25/21             PT Long Term Goals - 03/11/21 1358      PT LONG TERM GOAL #1   Title Patient will report at least 75% improvement in symptoms for improved quality of life.    Time 4  Period Weeks    Status New    Target Date 04/08/21      PT LONG TERM GOAL #2   Title Patient will improve FOTO score by at least 10 points in order to indicate improved tolerance to activity.    Time 4    Period Weeks    Status New    Target Date 04/08/21                 Plan - 03/12/21 1127    Clinical Impression Statement Patient able to complete previously completed exercises with good mechanics with min/no cueing. Patient requires tactile and verbal cueing to avoid compensation with hip abductor strengthening exercise. Patient showing good hip hinging ability with intermittent cueing for mechanics and use of PVC pipe for posture. Began postural and core strengthening with resistance bands which causes minimal change in symptoms during. Patient feeling the same at end of session. Patient will continue to benefit from skilled physical therapy in order to reduce impairment and improve function.    Personal Factors and Comorbidities Comorbidity 1;Fitness    Comorbidities hx back pain    Examination-Activity Limitations Other;Lift;Bend   golf   Examination-Participation Restrictions Yard Work;Community Activity;Other    golf   Stability/Clinical Decision Making Stable/Uncomplicated    Rehab Potential Good    PT Frequency 2x / week    PT Duration 4 weeks    PT Treatment/Interventions ADLs/Self Care Home Management;Aquatic Therapy;Cryotherapy;Electrical Stimulation;Iontophoresis 4mg /ml Dexamethasone;Moist Heat;Traction;Ultrasound;DME Instruction;Gait training;Stair training;Functional mobility training;Therapeutic activities;Therapeutic exercise;Balance training;Neuromuscular re-education;Patient/family education;Dry needling;Energy conservation;Manual techniques;Passive range of motion;Splinting;Taping;Spinal Manipulations;Joint Manipulations    PT Next Visit Plan hip abductor/extensor strength, core strength, lumbar mobility, possibly chops/lifts    PT Home Exercise Plan 5/10 bridge, hip ext 5/11 hip hinge, hip abd, palof    Consulted and Agree with Plan of Care Patient           Patient will benefit from skilled therapeutic intervention in order to improve the following deficits and impairments:  Decreased range of motion,Hypomobility,Decreased mobility,Decreased strength,Postural dysfunction,Pain,Improper body mechanics  Visit Diagnosis: Low back pain, unspecified back pain laterality, unspecified chronicity, unspecified whether sciatica present  Muscle weakness (generalized)  Other abnormalities of gait and mobility  Other symptoms and signs involving the musculoskeletal system     Problem List Patient Active Problem List   Diagnosis Date Noted  . Chest pain 07/15/2015  . Palpitations 07/15/2015  . Hyperlipidemia 07/15/2015  . OSTEOMYELITIS, CHRONIC 08/05/2004    12:11 PM, 03/12/21 Leroy Hines PT, DPT Physical Therapist at [place], [place], [address] Phone: [phone]   Fax:  [phone]  Name: Leroy Hines MRN: [id number] Date of Birth: [birth year]

## 2021-03-17 ENCOUNTER — Telehealth (HOSPITAL_COMMUNITY): Payer: Self-pay | Admitting: Physical Therapy

## 2021-03-17 ENCOUNTER — Encounter (HOSPITAL_COMMUNITY): Payer: PRIVATE HEALTH INSURANCE | Admitting: Physical Therapy

## 2021-03-17 NOTE — Telephone Encounter (Signed)
Patient requested to be d/c he is doing better and doing his exercises at home.

## 2021-03-18 ENCOUNTER — Ambulatory Visit (HOSPITAL_COMMUNITY): Payer: Medicare Other | Admitting: Physical Therapy

## 2021-03-19 ENCOUNTER — Encounter (HOSPITAL_COMMUNITY): Payer: PRIVATE HEALTH INSURANCE | Admitting: Physical Therapy

## 2021-03-24 ENCOUNTER — Ambulatory Visit (HOSPITAL_COMMUNITY): Payer: Medicare Other | Admitting: Physical Therapy

## 2021-03-27 ENCOUNTER — Ambulatory Visit (HOSPITAL_COMMUNITY): Payer: Medicare Other | Admitting: Physical Therapy

## 2021-03-27 DIAGNOSIS — Z111 Encounter for screening for respiratory tuberculosis: Secondary | ICD-10-CM | POA: Diagnosis not present

## 2021-03-27 DIAGNOSIS — R5383 Other fatigue: Secondary | ICD-10-CM | POA: Diagnosis not present

## 2021-03-27 DIAGNOSIS — M0609 Rheumatoid arthritis without rheumatoid factor, multiple sites: Secondary | ICD-10-CM | POA: Diagnosis not present

## 2021-03-27 DIAGNOSIS — Z79899 Other long term (current) drug therapy: Secondary | ICD-10-CM | POA: Diagnosis not present

## 2021-04-02 ENCOUNTER — Encounter (HOSPITAL_COMMUNITY): Payer: PRIVATE HEALTH INSURANCE | Admitting: Physical Therapy

## 2021-04-07 ENCOUNTER — Encounter (HOSPITAL_COMMUNITY): Payer: PRIVATE HEALTH INSURANCE | Admitting: Physical Therapy

## 2021-04-07 NOTE — Progress Notes (Signed)
Cardiology Office Note   Date:  04/08/2021   ID:  JAGGER DEMONTE, DOB 10-Apr-1951, MRN [id number]  PCP:  Josetta Huddle, MD  Cardiologist:   Dorris Carnes, MD   F/u of CAD and blood pressure     History of Present Illness: ALECXANDER Hines is a 70 y.o. male with a history of HTN and  coronary calcifications, CP in past   Stress test in 2016   Pt declinied statin     I saw the pt in June 2021  The pt feels good   He remains active  Plays golf often  Denies CP  Breathing is OK  No dizziness  No palpitations         Current Meds  Medication Sig  . aspirin EC 81 MG tablet Take 81 mg by mouth daily.  Marland Kitchen CALCIUM-MAGNESIUM-ZINC PO Take by mouth daily.  . Certolizumab Pegol (CIMZIA [place]) Inject into the skin every 30 (thirty) days.  . Coenzyme Q10 (CO Q10 PO) Take by mouth.  . methocarbamol (ROBAXIN) 500 MG tablet Take 500 mg by mouth 4 (four) times daily.  . multivitamin-iron-minerals-folic acid (CENTRUM) chewable tablet Chew 1 tablet by mouth daily.  . Omega-3 Fatty Acids (FISH OIL PO) Take 1,000 mg by mouth daily.  . Red Yeast Rice 600 MG CAPS Take 600 capsules by mouth 2 (two) times a day.  . sildenafil (REVATIO) 20 MG tablet Take 20 mg by mouth as directed.  . TURMERIC PO Take 500 mg by mouth daily.   . vitamin C (ASCORBIC ACID) 500 MG tablet Take 500 mg by mouth daily.  . Vitamin D, Cholecalciferol, 1000 UNITS TABS Take 1,000 mg by mouth daily.     Allergies:   Celecoxib, Pravastatin sodium, Rosuvastatin calcium, and Sulfamethoxazole-trimethoprim   Past Medical History:  Diagnosis Date  . Arthritis   . Rheumatoid arthritis ([place])   . Sports hernia     Past Surgical History:  Procedure Laterality Date  . SHOULDER SURGERY     right and left.     Social History:  The patient  reports that he has never smoked. He has never used smokeless tobacco. He reports current alcohol use of about 24.0 standard drinks of alcohol per week. He reports that he does not use drugs.    Family History:  The patient's family history includes Cancer in his father and mother; Healthy in his brother; Heart disease in his mother.    ROS:  Please see the history of present illness. All other systems are reviewed and  Negative to the above problem except as noted.    PHYSICAL EXAM: VS:  BP 120/80   Pulse 69   Ht 5\' 10"  (1.778 m)   Wt 185 lb (83.9 kg)   SpO2 97%   BMI 26.54 kg/m   GEN: Well nourished 70 yo in no acute distress  HEENT: normal  Neck: no JVD, carotid bruits Cardiac: RRR; no murmurs  No LE edema  Respiratory:  clear to auscultation bilaterally, normal work of breathing GI: soft, nontender, nondistended, + BS  No hepatomegaly  MS: no deformity Moving all extremities   Skin: warm and dry, no rash Neuro:  Strength and sensation are intact Psych: euthymic mood, full affect   EKG:  EKG is ordered today.  SR 67 bpm  Possible IWMI     Lipid Panel    Component Value Date/Time   CHOL 159 01/09/2019 0948   CHOL 166 12/10/2017 1052   TRIG 83  01/09/2019 0948   HDL 58 01/09/2019 0948   HDL 52 12/10/2017 1052   CHOLHDL 2.7 01/09/2019 0948   VLDL 17 01/09/2019 0948   LDLCALC 84 01/09/2019 0948   LDLCALC 101 (H) 12/10/2017 1052    LIpids from Mar 25, 2020:   LDL 89   HDL 68  Tri 52  Total 168    Wt Readings from Last 3 Encounters:  04/08/21 185 lb (83.9 kg)  04/04/20 182 lb (82.6 kg)  03/20/19 182 lb (82.6 kg)      ASSESSMENT AND PLAN:  1  Hx coronary calcifications    Remans very active  No symtoms of again   Follow    2  Blood pressure   BP  OK at 120/ 80  COntineu meds   3  HL  Lipids earlier this year   LDL 83  HDL 64  Trig 72  Discussed diet   Would consider lipomed to eval  uate particle number/llpids furhter    Stay active   Keep with diet    F/U in 1 year      Current medicines are reviewed at length with the patient today.  The patient does not have concerns regarding medicines.  Signed, Dorris Carnes, MD  04/08/2021 3:44 PM    [facility] [place], [place], [place]  [address] Phone: [phone]; Fax: [phone]

## 2021-04-08 ENCOUNTER — Other Ambulatory Visit: Payer: Self-pay

## 2021-04-08 ENCOUNTER — Encounter: Payer: Self-pay | Admitting: Internal Medicine

## 2021-04-08 ENCOUNTER — Ambulatory Visit (INDEPENDENT_AMBULATORY_CARE_PROVIDER_SITE_OTHER): Payer: Medicare Other | Admitting: Internal Medicine

## 2021-04-08 VITALS — BP 120/80 | HR 69 | Ht 70.0 in | Wt 185.0 lb

## 2021-04-08 DIAGNOSIS — I10 Essential (primary) hypertension: Secondary | ICD-10-CM

## 2021-04-08 DIAGNOSIS — E782 Mixed hyperlipidemia: Secondary | ICD-10-CM | POA: Diagnosis not present

## 2021-04-08 NOTE — Patient Instructions (Signed)
Medication Instructions:  Your physician recommends that you continue on your current medications as directed. Please refer to the Current Medication list given to you today.  *If you need a refill on your cardiac medications before your next appointment, please call your pharmacy*   Lab Work: Your physician recommends that you return for lab work in: Fasting   If you have labs (blood work) drawn today and your tests are completely normal, you will receive your results only by: Marland Kitchen MyChart Message (if you have MyChart) OR . A paper copy in the mail If you have any lab test that is abnormal or we need to change your treatment, we will call you to review the results.   Testing/Procedures: NONE    Follow-Up: At [facility], you and your health needs are our priority.  As part of our continuing mission to provide you with exceptional heart care, we have created designated Provider Care Teams.  These Care Teams include your primary Cardiologist (physician) and Advanced Practice Providers (APPs -  Physician Assistants and Nurse Practitioners) who all work together to provide you with the care you need, when you need it.  We recommend signing up for the patient portal called "MyChart".  Sign up information is provided on this After Visit Summary.  MyChart is used to connect with patients for Virtual Visits (Telemedicine).  Patients are able to view lab/test results, encounter notes, upcoming appointments, etc.  Non-urgent messages can be sent to your provider as well.   To learn more about what you can do with MyChart, go to [email or web address].    Your next appointment:   1 year(s)  The format for your next appointment:   In Person  Provider:   Dorris Carnes, MD   Other Instructions Thank you for choosing [facility]!

## 2021-04-09 ENCOUNTER — Encounter (HOSPITAL_COMMUNITY): Payer: PRIVATE HEALTH INSURANCE | Admitting: Physical Therapy

## 2021-04-24 DIAGNOSIS — E559 Vitamin D deficiency, unspecified: Secondary | ICD-10-CM | POA: Diagnosis not present

## 2021-04-24 DIAGNOSIS — E782 Mixed hyperlipidemia: Secondary | ICD-10-CM | POA: Diagnosis not present

## 2021-04-24 DIAGNOSIS — Z125 Encounter for screening for malignant neoplasm of prostate: Secondary | ICD-10-CM | POA: Diagnosis not present

## 2021-04-24 DIAGNOSIS — Z79899 Other long term (current) drug therapy: Secondary | ICD-10-CM | POA: Diagnosis not present

## 2021-04-24 DIAGNOSIS — M069 Rheumatoid arthritis, unspecified: Secondary | ICD-10-CM | POA: Diagnosis not present

## 2021-04-24 DIAGNOSIS — I2510 Atherosclerotic heart disease of native coronary artery without angina pectoris: Secondary | ICD-10-CM | POA: Diagnosis not present

## 2021-04-24 DIAGNOSIS — M0609 Rheumatoid arthritis without rheumatoid factor, multiple sites: Secondary | ICD-10-CM | POA: Diagnosis not present

## 2021-04-28 DIAGNOSIS — E782 Mixed hyperlipidemia: Secondary | ICD-10-CM | POA: Diagnosis not present

## 2021-04-28 DIAGNOSIS — M1990 Unspecified osteoarthritis, unspecified site: Secondary | ICD-10-CM | POA: Diagnosis not present

## 2021-04-28 DIAGNOSIS — I2510 Atherosclerotic heart disease of native coronary artery without angina pectoris: Secondary | ICD-10-CM | POA: Diagnosis not present

## 2021-04-28 DIAGNOSIS — M0689 Other specified rheumatoid arthritis, multiple sites: Secondary | ICD-10-CM | POA: Diagnosis not present

## 2021-04-28 DIAGNOSIS — M069 Rheumatoid arthritis, unspecified: Secondary | ICD-10-CM | POA: Diagnosis not present

## 2021-04-29 DIAGNOSIS — Z1389 Encounter for screening for other disorder: Secondary | ICD-10-CM | POA: Diagnosis not present

## 2021-04-29 DIAGNOSIS — I2510 Atherosclerotic heart disease of native coronary artery without angina pectoris: Secondary | ICD-10-CM | POA: Diagnosis not present

## 2021-04-29 DIAGNOSIS — Z0000 Encounter for general adult medical examination without abnormal findings: Secondary | ICD-10-CM | POA: Diagnosis not present

## 2021-04-29 DIAGNOSIS — Z125 Encounter for screening for malignant neoplasm of prostate: Secondary | ICD-10-CM | POA: Diagnosis not present

## 2021-04-29 DIAGNOSIS — I700 Atherosclerosis of aorta: Secondary | ICD-10-CM | POA: Diagnosis not present

## 2021-04-29 DIAGNOSIS — Z79899 Other long term (current) drug therapy: Secondary | ICD-10-CM | POA: Diagnosis not present

## 2021-04-29 DIAGNOSIS — E782 Mixed hyperlipidemia: Secondary | ICD-10-CM | POA: Diagnosis not present

## 2021-04-29 DIAGNOSIS — N529 Male erectile dysfunction, unspecified: Secondary | ICD-10-CM | POA: Diagnosis not present

## 2021-04-29 DIAGNOSIS — E559 Vitamin D deficiency, unspecified: Secondary | ICD-10-CM | POA: Diagnosis not present

## 2021-04-29 DIAGNOSIS — M069 Rheumatoid arthritis, unspecified: Secondary | ICD-10-CM | POA: Diagnosis not present

## 2021-04-29 DIAGNOSIS — Z86010 Personal history of colonic polyps: Secondary | ICD-10-CM | POA: Diagnosis not present

## 2021-04-29 DIAGNOSIS — I4510 Unspecified right bundle-branch block: Secondary | ICD-10-CM | POA: Diagnosis not present

## 2021-05-13 DIAGNOSIS — M0609 Rheumatoid arthritis without rheumatoid factor, multiple sites: Secondary | ICD-10-CM | POA: Diagnosis not present

## 2021-05-13 DIAGNOSIS — Z79899 Other long term (current) drug therapy: Secondary | ICD-10-CM | POA: Diagnosis not present

## 2021-05-13 DIAGNOSIS — Z6826 Body mass index (BMI) 26.0-26.9, adult: Secondary | ICD-10-CM | POA: Diagnosis not present

## 2021-05-13 DIAGNOSIS — E663 Overweight: Secondary | ICD-10-CM | POA: Diagnosis not present

## 2021-05-22 DIAGNOSIS — M0609 Rheumatoid arthritis without rheumatoid factor, multiple sites: Secondary | ICD-10-CM | POA: Diagnosis not present

## 2021-06-16 DIAGNOSIS — E782 Mixed hyperlipidemia: Secondary | ICD-10-CM | POA: Diagnosis not present

## 2021-06-16 DIAGNOSIS — I2510 Atherosclerotic heart disease of native coronary artery without angina pectoris: Secondary | ICD-10-CM | POA: Diagnosis not present

## 2021-06-16 DIAGNOSIS — M1990 Unspecified osteoarthritis, unspecified site: Secondary | ICD-10-CM | POA: Diagnosis not present

## 2021-06-16 DIAGNOSIS — M0689 Other specified rheumatoid arthritis, multiple sites: Secondary | ICD-10-CM | POA: Diagnosis not present

## 2021-06-16 DIAGNOSIS — M069 Rheumatoid arthritis, unspecified: Secondary | ICD-10-CM | POA: Diagnosis not present

## 2021-06-19 DIAGNOSIS — M0609 Rheumatoid arthritis without rheumatoid factor, multiple sites: Secondary | ICD-10-CM | POA: Diagnosis not present

## 2021-06-29 IMAGING — DX DG LUMBAR SPINE 2-3V
3 series · 3 of 3 positions shown · non-contrast
Comparison: None.

CLINICAL DATA: Low back pain without sciatica

EXAM:
LUMBAR SPINE - 2-3 VIEW

[dg lumbar spine 2-3 views (1 of 3)]
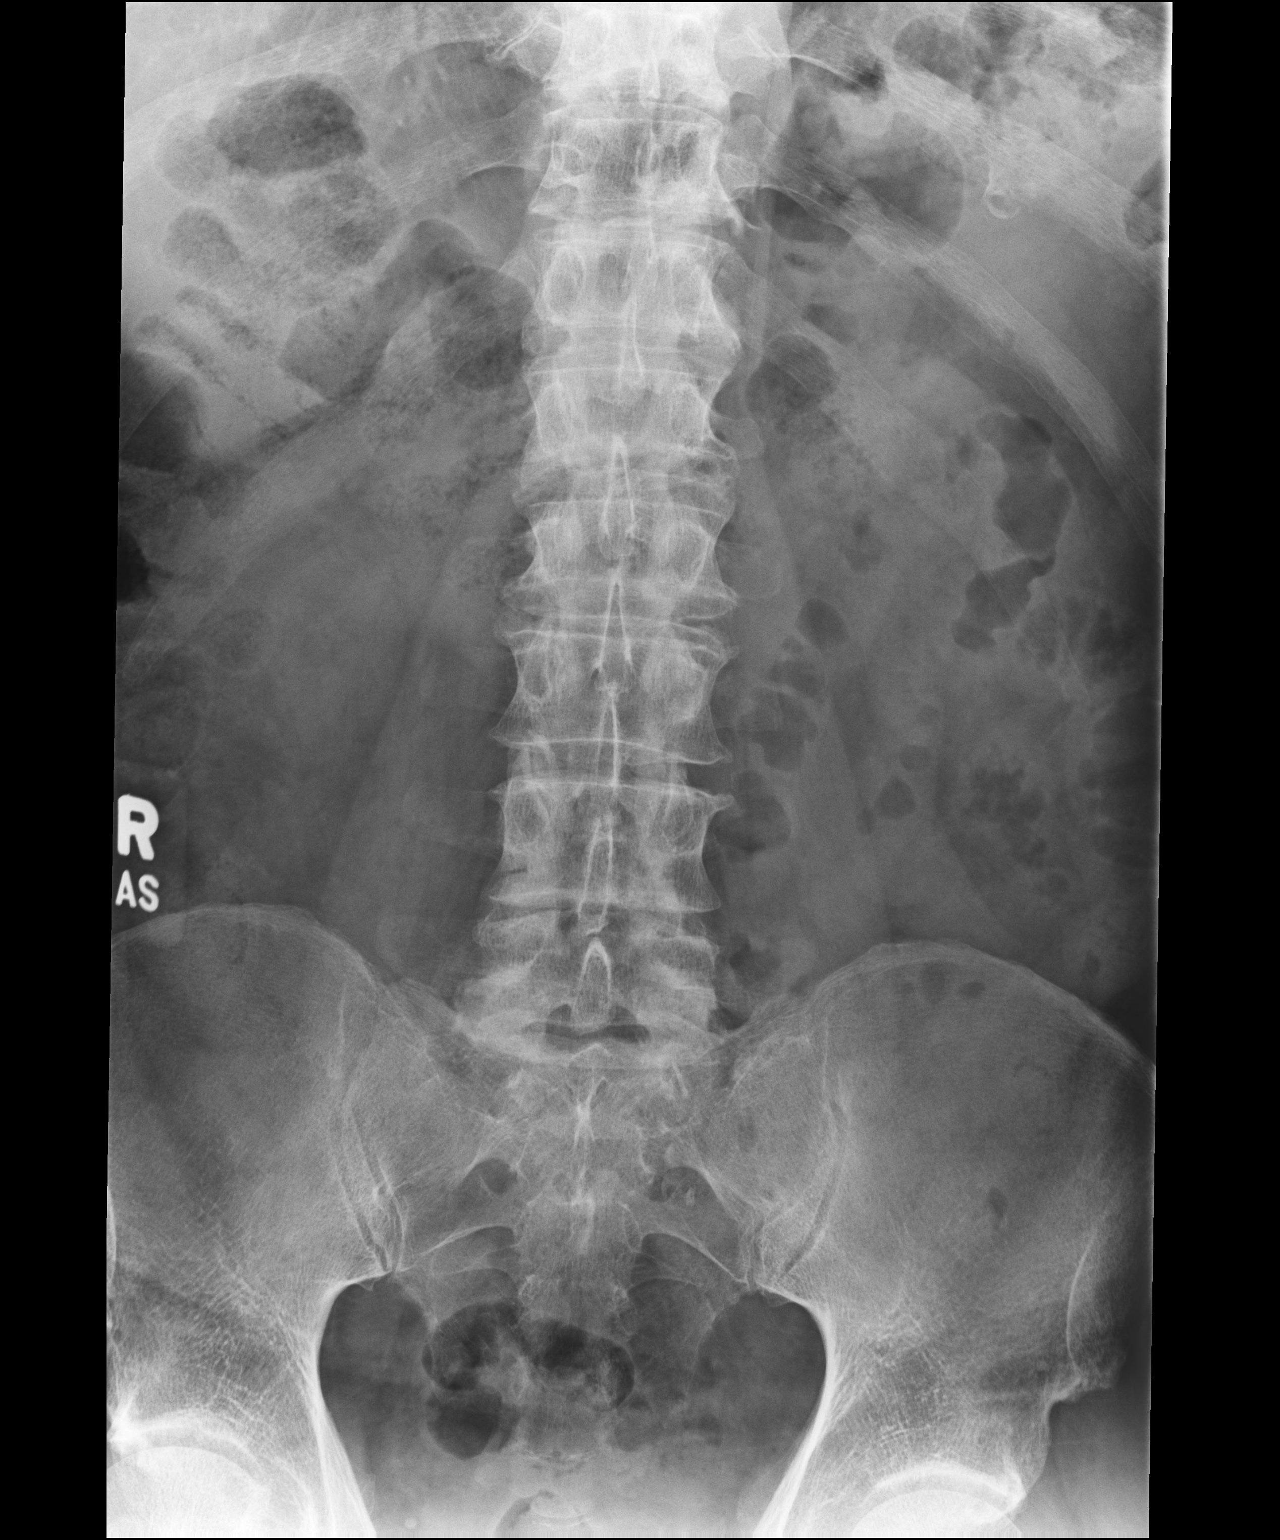

[dg lumbar spine 2-3 views (2 of 3)]
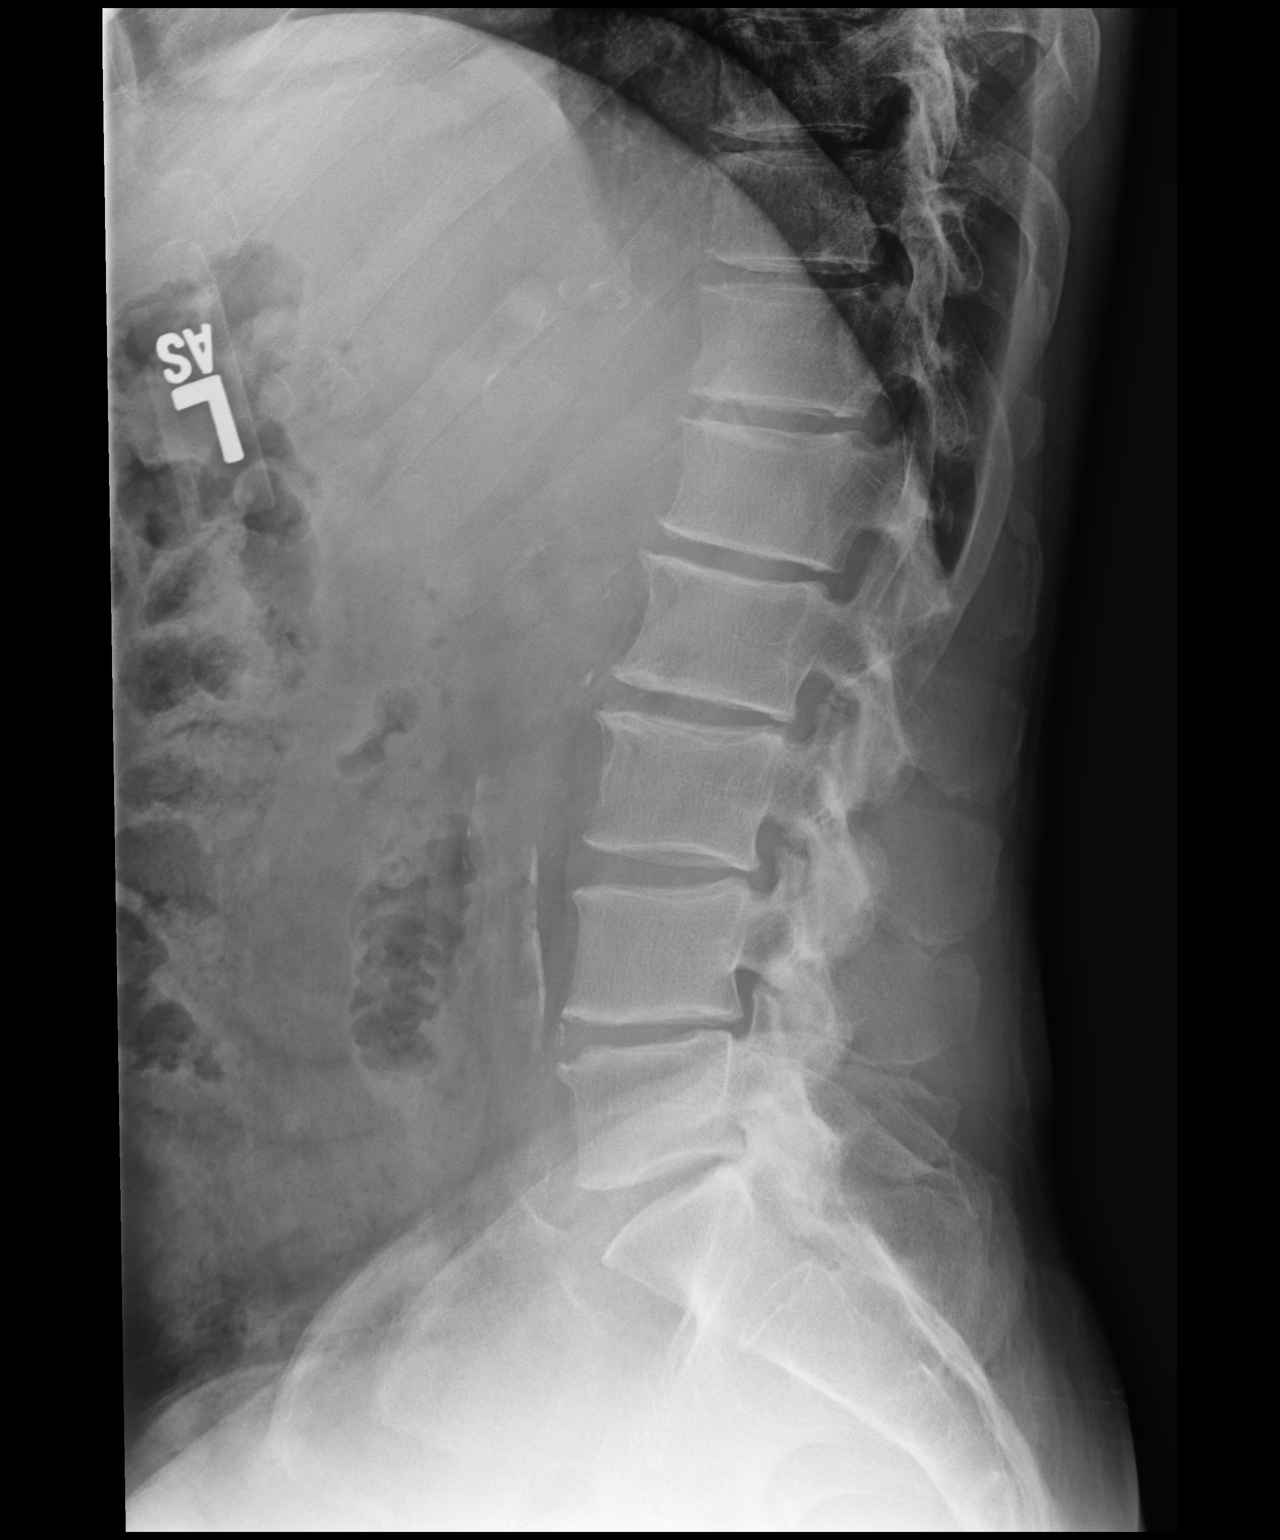

[dg lumbar spine 2-3 views (3 of 3)]
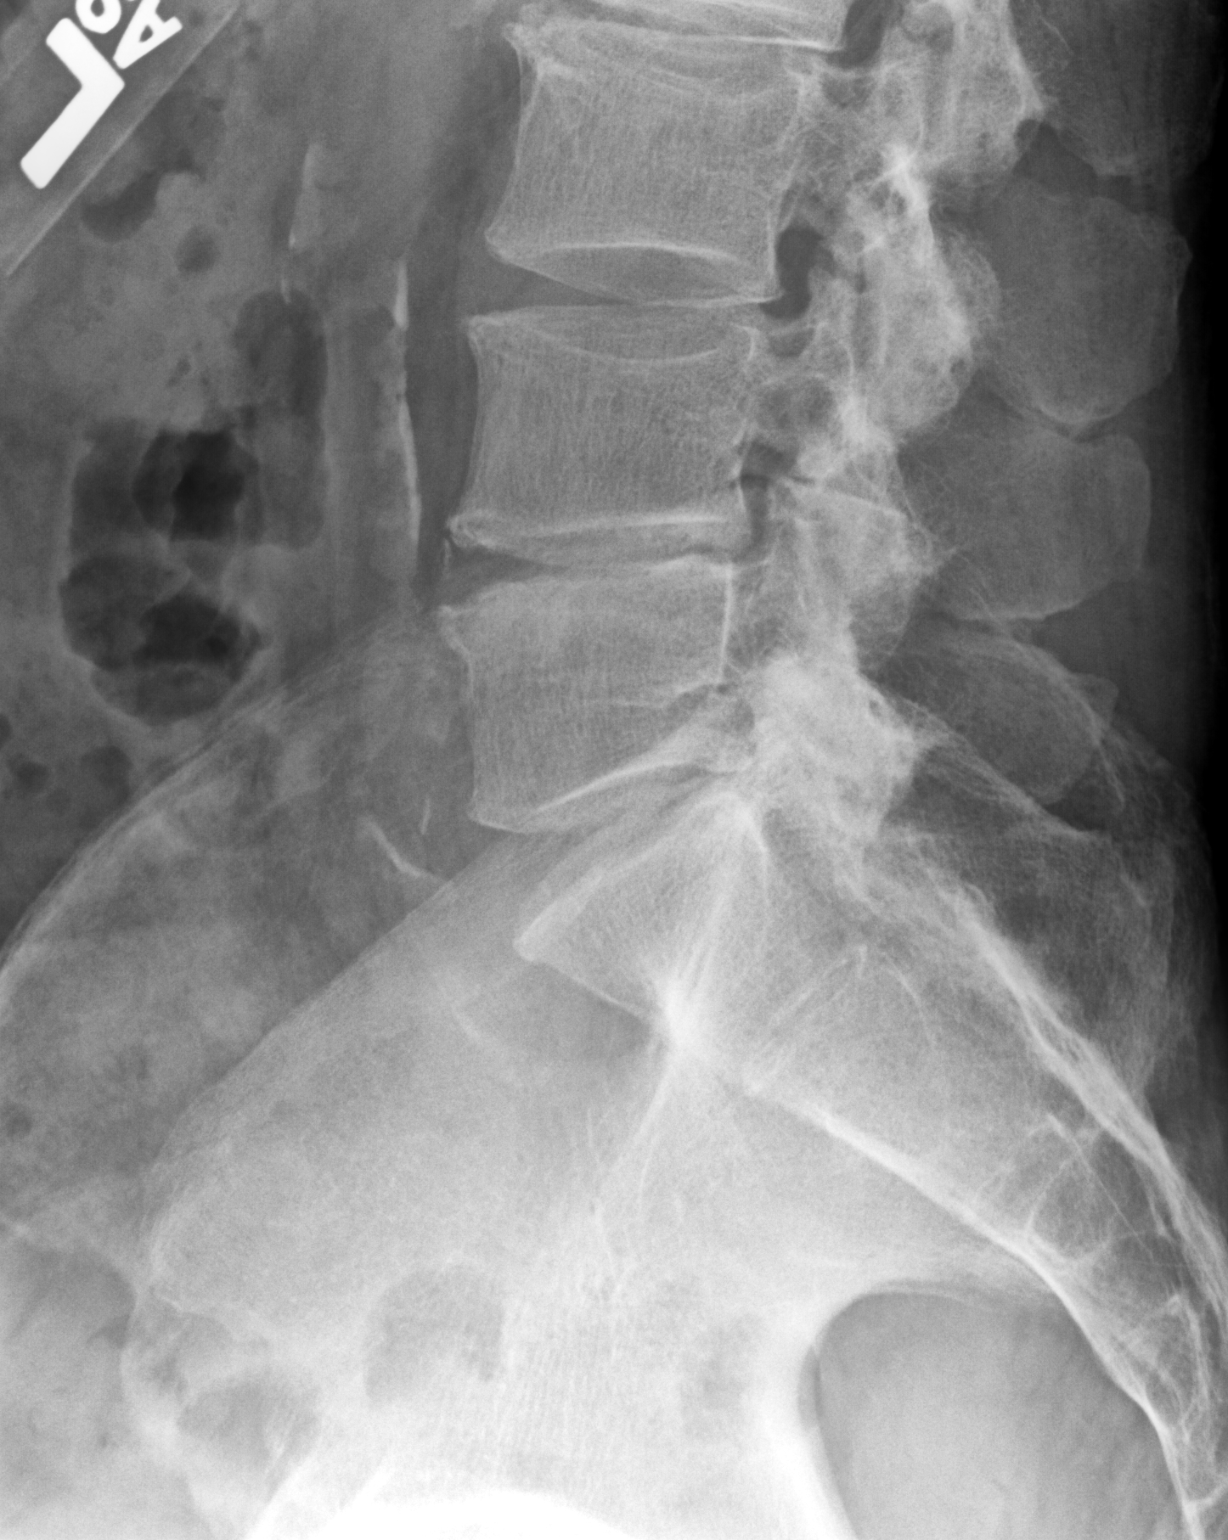

[3 of 3 positions shown; findings below may reference images not displayed]

FINDINGS: Normal alignment the lumbar vertebral bodies. Mild endplate
spurring. No loss of vertebral body height and disc height. No
subluxation. Atherosclerotic calcification of the aorta.
IMPRESSION: No acute osseous abnormality.

## 2021-07-21 DIAGNOSIS — M0609 Rheumatoid arthritis without rheumatoid factor, multiple sites: Secondary | ICD-10-CM | POA: Diagnosis not present

## 2021-07-21 DIAGNOSIS — Z79899 Other long term (current) drug therapy: Secondary | ICD-10-CM | POA: Diagnosis not present

## 2021-08-07 DIAGNOSIS — Z23 Encounter for immunization: Secondary | ICD-10-CM | POA: Diagnosis not present

## 2021-08-20 DIAGNOSIS — M0609 Rheumatoid arthritis without rheumatoid factor, multiple sites: Secondary | ICD-10-CM | POA: Diagnosis not present

## 2021-09-17 DIAGNOSIS — M0609 Rheumatoid arthritis without rheumatoid factor, multiple sites: Secondary | ICD-10-CM | POA: Diagnosis not present

## 2021-10-15 DIAGNOSIS — M0609 Rheumatoid arthritis without rheumatoid factor, multiple sites: Secondary | ICD-10-CM | POA: Diagnosis not present

## 2021-10-20 ENCOUNTER — Encounter (HOSPITAL_COMMUNITY): Payer: Self-pay | Admitting: Physical Therapy

## 2021-10-20 NOTE — Therapy (Signed)
[facility] [place], [place], [address] Phone: [phone]   Fax:  [phone]  Patient Details  Name: Leroy Hines MRN: [id number] Date of Birth: [birth year] Referring Provider:  No ref. provider found  Encounter Date: 10/20/2021  PHYSICAL THERAPY DISCHARGE SUMMARY  Visits from Start of Care: 2  Current functional level related to goals / functional outcomes: Patient stated he was feeling better.   Remaining deficits: unknown   Education / Equipment: unknown   Patient agrees to discharge. Patient goals were not met. Patient is being discharged due to being pleased with the current functional level.   9:30 AM, 10/20/21 Mearl Latin PT, DPT Physical Therapist at [facility] [place], [place], [address] Phone: [phone]   Fax:  [phone]

## 2021-11-12 DIAGNOSIS — M0609 Rheumatoid arthritis without rheumatoid factor, multiple sites: Secondary | ICD-10-CM | POA: Diagnosis not present

## 2021-11-13 DIAGNOSIS — Z6826 Body mass index (BMI) 26.0-26.9, adult: Secondary | ICD-10-CM | POA: Diagnosis not present

## 2021-11-13 DIAGNOSIS — M0609 Rheumatoid arthritis without rheumatoid factor, multiple sites: Secondary | ICD-10-CM | POA: Diagnosis not present

## 2021-11-13 DIAGNOSIS — E663 Overweight: Secondary | ICD-10-CM | POA: Diagnosis not present

## 2021-11-13 DIAGNOSIS — M79641 Pain in right hand: Secondary | ICD-10-CM | POA: Diagnosis not present

## 2021-11-13 DIAGNOSIS — Z79899 Other long term (current) drug therapy: Secondary | ICD-10-CM | POA: Diagnosis not present

## 2021-11-13 DIAGNOSIS — M720 Palmar fascial fibromatosis [Dupuytren]: Secondary | ICD-10-CM | POA: Diagnosis not present

## 2021-12-04 DIAGNOSIS — L570 Actinic keratosis: Secondary | ICD-10-CM | POA: Diagnosis not present

## 2021-12-04 DIAGNOSIS — L814 Other melanin hyperpigmentation: Secondary | ICD-10-CM | POA: Diagnosis not present

## 2021-12-04 DIAGNOSIS — Z85828 Personal history of other malignant neoplasm of skin: Secondary | ICD-10-CM | POA: Diagnosis not present

## 2021-12-04 DIAGNOSIS — D1801 Hemangioma of skin and subcutaneous tissue: Secondary | ICD-10-CM | POA: Diagnosis not present

## 2021-12-04 DIAGNOSIS — M720 Palmar fascial fibromatosis [Dupuytren]: Secondary | ICD-10-CM | POA: Diagnosis not present

## 2021-12-04 DIAGNOSIS — L821 Other seborrheic keratosis: Secondary | ICD-10-CM | POA: Diagnosis not present

## 2021-12-10 DIAGNOSIS — M0609 Rheumatoid arthritis without rheumatoid factor, multiple sites: Secondary | ICD-10-CM | POA: Diagnosis not present

## 2022-01-07 DIAGNOSIS — Z79899 Other long term (current) drug therapy: Secondary | ICD-10-CM | POA: Diagnosis not present

## 2022-01-07 DIAGNOSIS — E782 Mixed hyperlipidemia: Secondary | ICD-10-CM | POA: Diagnosis not present

## 2022-01-07 DIAGNOSIS — M0609 Rheumatoid arthritis without rheumatoid factor, multiple sites: Secondary | ICD-10-CM | POA: Diagnosis not present

## 2022-02-04 DIAGNOSIS — M0609 Rheumatoid arthritis without rheumatoid factor, multiple sites: Secondary | ICD-10-CM | POA: Diagnosis not present

## 2022-03-04 DIAGNOSIS — M0609 Rheumatoid arthritis without rheumatoid factor, multiple sites: Secondary | ICD-10-CM | POA: Diagnosis not present

## 2022-03-16 DIAGNOSIS — R21 Rash and other nonspecific skin eruption: Secondary | ICD-10-CM | POA: Diagnosis not present

## 2022-03-16 DIAGNOSIS — S1096XA Insect bite of unspecified part of neck, initial encounter: Secondary | ICD-10-CM | POA: Diagnosis not present

## 2022-03-17 DIAGNOSIS — B029 Zoster without complications: Secondary | ICD-10-CM | POA: Diagnosis not present

## 2022-03-17 DIAGNOSIS — B009 Herpesviral infection, unspecified: Secondary | ICD-10-CM | POA: Diagnosis not present

## 2022-03-17 DIAGNOSIS — Z85828 Personal history of other malignant neoplasm of skin: Secondary | ICD-10-CM | POA: Diagnosis not present

## 2022-04-01 DIAGNOSIS — Z111 Encounter for screening for respiratory tuberculosis: Secondary | ICD-10-CM | POA: Diagnosis not present

## 2022-04-01 DIAGNOSIS — M0609 Rheumatoid arthritis without rheumatoid factor, multiple sites: Secondary | ICD-10-CM | POA: Diagnosis not present

## 2022-04-01 DIAGNOSIS — R5383 Other fatigue: Secondary | ICD-10-CM | POA: Diagnosis not present

## 2022-04-01 DIAGNOSIS — Z79899 Other long term (current) drug therapy: Secondary | ICD-10-CM | POA: Diagnosis not present

## 2022-04-29 DIAGNOSIS — M0609 Rheumatoid arthritis without rheumatoid factor, multiple sites: Secondary | ICD-10-CM | POA: Diagnosis not present

## 2022-05-14 DIAGNOSIS — M0609 Rheumatoid arthritis without rheumatoid factor, multiple sites: Secondary | ICD-10-CM | POA: Diagnosis not present

## 2022-05-14 DIAGNOSIS — Z79899 Other long term (current) drug therapy: Secondary | ICD-10-CM | POA: Diagnosis not present

## 2022-05-14 DIAGNOSIS — E663 Overweight: Secondary | ICD-10-CM | POA: Diagnosis not present

## 2022-05-14 DIAGNOSIS — Z6826 Body mass index (BMI) 26.0-26.9, adult: Secondary | ICD-10-CM | POA: Diagnosis not present

## 2022-05-28 DIAGNOSIS — K5730 Diverticulosis of large intestine without perforation or abscess without bleeding: Secondary | ICD-10-CM | POA: Diagnosis not present

## 2022-05-28 DIAGNOSIS — Z86010 Personal history of colonic polyps: Secondary | ICD-10-CM | POA: Diagnosis not present

## 2022-05-28 DIAGNOSIS — K649 Unspecified hemorrhoids: Secondary | ICD-10-CM | POA: Diagnosis not present

## 2022-05-28 DIAGNOSIS — Z800 Family history of malignant neoplasm of digestive organs: Secondary | ICD-10-CM | POA: Diagnosis not present

## 2022-05-28 DIAGNOSIS — Z09 Encounter for follow-up examination after completed treatment for conditions other than malignant neoplasm: Secondary | ICD-10-CM | POA: Diagnosis not present

## 2022-05-28 DIAGNOSIS — D122 Benign neoplasm of ascending colon: Secondary | ICD-10-CM | POA: Diagnosis not present

## 2022-05-29 DIAGNOSIS — E782 Mixed hyperlipidemia: Secondary | ICD-10-CM | POA: Diagnosis not present

## 2022-05-29 DIAGNOSIS — Z125 Encounter for screening for malignant neoplasm of prostate: Secondary | ICD-10-CM | POA: Diagnosis not present

## 2022-05-29 DIAGNOSIS — E559 Vitamin D deficiency, unspecified: Secondary | ICD-10-CM | POA: Diagnosis not present

## 2022-05-29 DIAGNOSIS — I2510 Atherosclerotic heart disease of native coronary artery without angina pectoris: Secondary | ICD-10-CM | POA: Diagnosis not present

## 2022-05-29 DIAGNOSIS — I4510 Unspecified right bundle-branch block: Secondary | ICD-10-CM | POA: Diagnosis not present

## 2022-05-29 DIAGNOSIS — Z79899 Other long term (current) drug therapy: Secondary | ICD-10-CM | POA: Diagnosis not present

## 2022-05-29 DIAGNOSIS — I700 Atherosclerosis of aorta: Secondary | ICD-10-CM | POA: Diagnosis not present

## 2022-05-29 DIAGNOSIS — M069 Rheumatoid arthritis, unspecified: Secondary | ICD-10-CM | POA: Diagnosis not present

## 2022-05-29 DIAGNOSIS — Z86010 Personal history of colonic polyps: Secondary | ICD-10-CM | POA: Diagnosis not present

## 2022-05-29 DIAGNOSIS — Z1331 Encounter for screening for depression: Secondary | ICD-10-CM | POA: Diagnosis not present

## 2022-05-29 DIAGNOSIS — N529 Male erectile dysfunction, unspecified: Secondary | ICD-10-CM | POA: Diagnosis not present

## 2022-05-29 DIAGNOSIS — M79609 Pain in unspecified limb: Secondary | ICD-10-CM | POA: Diagnosis not present

## 2022-05-29 DIAGNOSIS — Z0000 Encounter for general adult medical examination without abnormal findings: Secondary | ICD-10-CM | POA: Diagnosis not present

## 2022-06-01 DIAGNOSIS — M0609 Rheumatoid arthritis without rheumatoid factor, multiple sites: Secondary | ICD-10-CM | POA: Diagnosis not present

## 2022-06-01 DIAGNOSIS — D122 Benign neoplasm of ascending colon: Secondary | ICD-10-CM | POA: Diagnosis not present

## 2022-07-01 DIAGNOSIS — M0609 Rheumatoid arthritis without rheumatoid factor, multiple sites: Secondary | ICD-10-CM | POA: Diagnosis not present

## 2022-07-01 DIAGNOSIS — Z79899 Other long term (current) drug therapy: Secondary | ICD-10-CM | POA: Diagnosis not present

## 2022-07-29 DIAGNOSIS — M0609 Rheumatoid arthritis without rheumatoid factor, multiple sites: Secondary | ICD-10-CM | POA: Diagnosis not present

## 2022-08-17 DIAGNOSIS — Z23 Encounter for immunization: Secondary | ICD-10-CM | POA: Diagnosis not present

## 2022-08-26 DIAGNOSIS — M0609 Rheumatoid arthritis without rheumatoid factor, multiple sites: Secondary | ICD-10-CM | POA: Diagnosis not present

## 2022-09-28 DIAGNOSIS — M0609 Rheumatoid arthritis without rheumatoid factor, multiple sites: Secondary | ICD-10-CM | POA: Diagnosis not present

## 2022-10-28 DIAGNOSIS — Z79899 Other long term (current) drug therapy: Secondary | ICD-10-CM | POA: Diagnosis not present

## 2022-10-28 DIAGNOSIS — M0609 Rheumatoid arthritis without rheumatoid factor, multiple sites: Secondary | ICD-10-CM | POA: Diagnosis not present

## 2022-11-01 ENCOUNTER — Ambulatory Visit: Admit: 2022-11-01 | Discharge status: Absent without leave | Payer: Medicare Other

## 2022-11-03 DIAGNOSIS — J069 Acute upper respiratory infection, unspecified: Secondary | ICD-10-CM | POA: Diagnosis not present

## 2022-11-03 DIAGNOSIS — R059 Cough, unspecified: Secondary | ICD-10-CM | POA: Diagnosis not present

## 2022-11-11 ENCOUNTER — Other Ambulatory Visit: Payer: Self-pay | Admitting: Internal Medicine

## 2022-11-11 ENCOUNTER — Ambulatory Visit
Admission: RE | Admit: 2022-11-11 | Discharge: 2022-11-11 | Disposition: A | Discharge status: Home / Self Care | Payer: Medicare Other | Source: Ambulatory Visit | Attending: Internal Medicine | Admitting: Internal Medicine

## 2022-11-11 DIAGNOSIS — J410 Simple chronic bronchitis: Secondary | ICD-10-CM

## 2022-11-11 DIAGNOSIS — J40 Bronchitis, not specified as acute or chronic: Secondary | ICD-10-CM | POA: Diagnosis not present

## 2022-11-11 DIAGNOSIS — R059 Cough, unspecified: Secondary | ICD-10-CM | POA: Diagnosis not present

## 2022-11-25 DIAGNOSIS — Z79899 Other long term (current) drug therapy: Secondary | ICD-10-CM | POA: Diagnosis not present

## 2022-11-25 DIAGNOSIS — M0609 Rheumatoid arthritis without rheumatoid factor, multiple sites: Secondary | ICD-10-CM | POA: Diagnosis not present

## 2022-12-23 DIAGNOSIS — M0609 Rheumatoid arthritis without rheumatoid factor, multiple sites: Secondary | ICD-10-CM | POA: Diagnosis not present

## 2022-12-28 DIAGNOSIS — L570 Actinic keratosis: Secondary | ICD-10-CM | POA: Diagnosis not present

## 2022-12-28 DIAGNOSIS — Z85828 Personal history of other malignant neoplasm of skin: Secondary | ICD-10-CM | POA: Diagnosis not present

## 2022-12-28 DIAGNOSIS — D692 Other nonthrombocytopenic purpura: Secondary | ICD-10-CM | POA: Diagnosis not present

## 2022-12-28 DIAGNOSIS — L814 Other melanin hyperpigmentation: Secondary | ICD-10-CM | POA: Diagnosis not present

## 2022-12-28 DIAGNOSIS — L821 Other seborrheic keratosis: Secondary | ICD-10-CM | POA: Diagnosis not present

## 2022-12-28 DIAGNOSIS — D1801 Hemangioma of skin and subcutaneous tissue: Secondary | ICD-10-CM | POA: Diagnosis not present

## 2023-01-07 DIAGNOSIS — M940 Chondrocostal junction syndrome [Tietze]: Secondary | ICD-10-CM | POA: Diagnosis not present

## 2023-01-07 DIAGNOSIS — M0609 Rheumatoid arthritis without rheumatoid factor, multiple sites: Secondary | ICD-10-CM | POA: Diagnosis not present

## 2023-01-07 DIAGNOSIS — Z79899 Other long term (current) drug therapy: Secondary | ICD-10-CM | POA: Diagnosis not present

## 2023-01-07 DIAGNOSIS — Z6826 Body mass index (BMI) 26.0-26.9, adult: Secondary | ICD-10-CM | POA: Diagnosis not present

## 2023-01-07 DIAGNOSIS — E663 Overweight: Secondary | ICD-10-CM | POA: Diagnosis not present

## 2023-01-11 DIAGNOSIS — I700 Atherosclerosis of aorta: Secondary | ICD-10-CM | POA: Diagnosis not present

## 2023-01-11 DIAGNOSIS — M069 Rheumatoid arthritis, unspecified: Secondary | ICD-10-CM | POA: Diagnosis not present

## 2023-01-11 DIAGNOSIS — R21 Rash and other nonspecific skin eruption: Secondary | ICD-10-CM | POA: Diagnosis not present

## 2023-01-11 DIAGNOSIS — M940 Chondrocostal junction syndrome [Tietze]: Secondary | ICD-10-CM | POA: Diagnosis not present

## 2023-01-13 ENCOUNTER — Telehealth: Payer: Self-pay | Admitting: Internal Medicine

## 2023-01-13 DIAGNOSIS — E782 Mixed hyperlipidemia: Secondary | ICD-10-CM

## 2023-01-13 NOTE — Telephone Encounter (Signed)
Patient is following-up on order for any lab work to be done prior to next visit.

## 2023-01-13 NOTE — Telephone Encounter (Signed)
Patient has labs for NMR, LP(a), Apolipoprotein B to be completed, however they are expired. Does patient need labs drawn prior to visit with provider?   Please advise

## 2023-01-14 ENCOUNTER — Ambulatory Visit
Admission: RE | Admit: 2023-01-14 | Discharge: 2023-01-14 | Disposition: A | Discharge status: Home / Self Care | Payer: Medicare Other | Source: Ambulatory Visit | Attending: Internal Medicine | Admitting: Internal Medicine

## 2023-01-14 ENCOUNTER — Other Ambulatory Visit: Payer: Self-pay | Admitting: Internal Medicine

## 2023-01-14 DIAGNOSIS — R0789 Other chest pain: Secondary | ICD-10-CM

## 2023-01-14 DIAGNOSIS — R079 Chest pain, unspecified: Secondary | ICD-10-CM | POA: Diagnosis not present

## 2023-01-14 NOTE — Telephone Encounter (Signed)
Spoke to pt who verbalized understanding of getting labs prior to visit with provider in May. Patient had no further questions at this time.

## 2023-01-20 DIAGNOSIS — M0609 Rheumatoid arthritis without rheumatoid factor, multiple sites: Secondary | ICD-10-CM | POA: Diagnosis not present

## 2023-02-17 DIAGNOSIS — M0609 Rheumatoid arthritis without rheumatoid factor, multiple sites: Secondary | ICD-10-CM | POA: Diagnosis not present

## 2023-03-11 ENCOUNTER — Telehealth: Payer: Self-pay | Admitting: Internal Medicine

## 2023-03-11 NOTE — Telephone Encounter (Signed)
Labs routed to Dr. Nickola Major. Patient notified and verbalized understanding.

## 2023-03-11 NOTE — Telephone Encounter (Signed)
Patient wants orders for labs released to his rheumatologist as he will be having blood drawn there on 5/15. Office of Dr. Zenovia Jordan, ph# [phone].  Patient wants confirmation when orders sent.

## 2023-03-16 ENCOUNTER — Telehealth: Payer: Self-pay | Admitting: Internal Medicine

## 2023-03-16 NOTE — Telephone Encounter (Signed)
Pt wanted to call and let office know that after having his lab orders released to his rheumatologist, he stated the couldn't do the labs due to not having the right size tubes. Pt stated he's going to go to [facility] tomorrow to see about having them done there. Please advise.

## 2023-03-16 NOTE — Telephone Encounter (Signed)
Noted. Patient can have labs drawn at [facility].

## 2023-03-17 DIAGNOSIS — Z111 Encounter for screening for respiratory tuberculosis: Secondary | ICD-10-CM | POA: Diagnosis not present

## 2023-03-17 DIAGNOSIS — R5383 Other fatigue: Secondary | ICD-10-CM | POA: Diagnosis not present

## 2023-03-17 DIAGNOSIS — M0609 Rheumatoid arthritis without rheumatoid factor, multiple sites: Secondary | ICD-10-CM | POA: Diagnosis not present

## 2023-03-17 DIAGNOSIS — Z79899 Other long term (current) drug therapy: Secondary | ICD-10-CM | POA: Diagnosis not present

## 2023-03-18 ENCOUNTER — Other Ambulatory Visit (HOSPITAL_COMMUNITY)
Admission: RE | Admit: 2023-03-18 | Discharge: 2023-03-18 | Disposition: A | Discharge status: Home / Self Care | Payer: Medicare Other | Source: Ambulatory Visit | Attending: Internal Medicine | Admitting: Internal Medicine

## 2023-03-18 DIAGNOSIS — E782 Mixed hyperlipidemia: Secondary | ICD-10-CM | POA: Insufficient documentation

## 2023-03-19 LAB — MISC LABCORP TEST (SEND OUT): Labcorp test code: 884247

## 2023-03-19 LAB — LIPOPROTEIN A (LPA): Lipoprotein (a): <8.4 nmol/L (ref <75.0)

## 2023-03-21 LAB — MISC LABCORP TEST (SEND OUT): Labcorp test code: 167015

## 2023-03-22 ENCOUNTER — Ambulatory Visit: Payer: Medicare Other | Admitting: Internal Medicine

## 2023-03-23 ENCOUNTER — Other Ambulatory Visit: Payer: Self-pay

## 2023-03-23 DIAGNOSIS — E782 Mixed hyperlipidemia: Secondary | ICD-10-CM

## 2023-03-25 DIAGNOSIS — B349 Viral infection, unspecified: Secondary | ICD-10-CM | POA: Diagnosis not present

## 2023-03-25 DIAGNOSIS — R030 Elevated blood-pressure reading, without diagnosis of hypertension: Secondary | ICD-10-CM | POA: Diagnosis not present

## 2023-03-25 DIAGNOSIS — Z03818 Encounter for observation for suspected exposure to other biological agents ruled out: Secondary | ICD-10-CM | POA: Diagnosis not present

## 2023-03-25 DIAGNOSIS — R051 Acute cough: Secondary | ICD-10-CM | POA: Diagnosis not present

## 2023-03-25 DIAGNOSIS — R509 Fever, unspecified: Secondary | ICD-10-CM | POA: Diagnosis not present

## 2023-04-09 DIAGNOSIS — R079 Chest pain, unspecified: Secondary | ICD-10-CM | POA: Diagnosis not present

## 2023-04-14 DIAGNOSIS — M0609 Rheumatoid arthritis without rheumatoid factor, multiple sites: Secondary | ICD-10-CM | POA: Diagnosis not present

## 2023-05-12 DIAGNOSIS — M0609 Rheumatoid arthritis without rheumatoid factor, multiple sites: Secondary | ICD-10-CM | POA: Diagnosis not present

## 2023-05-17 ENCOUNTER — Other Ambulatory Visit: Payer: Self-pay

## 2023-05-17 DIAGNOSIS — E782 Mixed hyperlipidemia: Secondary | ICD-10-CM

## 2023-05-19 DIAGNOSIS — E782 Mixed hyperlipidemia: Secondary | ICD-10-CM | POA: Diagnosis not present

## 2023-05-21 LAB — NMR, LIPOPROFILE
Cholesterol, Total: 183 mg/dL (ref 100–199)
HDL Particle Number: 41.2 umol/L (ref >=30.5)
HDL-C: 76 mg/dL (ref >39)
LDL Particle Number: 969 nmol/L (ref <1000)
LDL Size: 20.9 nm (ref >20.5)
LDL-C (NIH Calc): 92 mg/dL (ref 0–99)
LP-IR Score: <25 (ref <=45)
Small LDL Particle Number: 310 nmol/L (ref <=527)
Triglycerides: 80 mg/dL (ref 0–149)

## 2023-05-24 ENCOUNTER — Ambulatory Visit: Payer: Medicare Other | Attending: Internal Medicine | Admitting: Internal Medicine

## 2023-05-24 ENCOUNTER — Encounter: Payer: Self-pay | Admitting: Internal Medicine

## 2023-05-24 VITALS — BP 136/78 | HR 65 | Ht 70.0 in | Wt 183.0 lb

## 2023-05-24 DIAGNOSIS — R002 Palpitations: Secondary | ICD-10-CM | POA: Insufficient documentation

## 2023-05-24 DIAGNOSIS — E782 Mixed hyperlipidemia: Secondary | ICD-10-CM | POA: Diagnosis not present

## 2023-05-24 NOTE — Patient Instructions (Signed)
Medication Instructions:  Your physician recommends that you continue on your current medications as directed. Please refer to the Current Medication list given to you today.  *If you need a refill on your cardiac medications before your next appointment, please call your pharmacy*   Lab Work: NONE   If you have labs (blood work) drawn today and your tests are completely normal, you will receive your results only by: MyChart Message (if you have MyChart) OR A paper copy in the mail If you have any lab test that is abnormal or we need to change your treatment, we will call you to review the results.   Testing/Procedures: NONE     Follow-Up: At [facility], you and your health needs are our priority.  As part of our continuing mission to provide you with exceptional heart care, we have created designated Provider Care Teams.  These Care Teams include your primary Cardiologist (physician) and Advanced Practice Providers (APPs -  Physician Assistants and Nurse Practitioners) who all work together to provide you with the care you need, when you need it.  We recommend signing up for the patient portal called "MyChart".  Sign up information is provided on this After Visit Summary.  MyChart is used to connect with patients for Virtual Visits (Telemedicine).  Patients are able to view lab/test results, encounter notes, upcoming appointments, etc.  Non-urgent messages can be sent to your provider as well.   To learn more about what you can do with MyChart, go to [email or web address].    Your next appointment:   1 year(s)  Provider:   You may see Dietrich Pates, MD or one of the following Advanced Practice Providers on your designated Care Team:   Randall An, PA-C  Jacolyn Reedy, PA-C     Other Instructions Thank you for choosing [facility]!

## 2023-05-24 NOTE — Progress Notes (Signed)
Cardiology Office Note   Date:  05/24/2023   ID:  Leroy Hines, DOB January 20, 1951, MRN [id number]  PCP:  [facility]  Cardiologist:   Leroy Pates, MD   F/u of CAD and blood pressure     History of Present Illness: Leroy Hines is a 73 y.o. male with a history of HTN and  coronary calcifications, CP in past   Stress test in 2016   Pt declinied statin     I saw the pt in June 2022 Since seen he continues to do well   No CP No SOB   No palpitations  \  Current Meds  Medication Sig   CALCIUM-MAGNESIUM-ZINC PO Take by mouth daily.   Certolizumab Pegol (CIMZIA [place]) Inject into the skin every 30 (thirty) days.   Coenzyme Q10 (CO Q10 PO) Take by mouth.   multivitamin-iron-minerals-folic acid (CENTRUM) chewable tablet Chew 1 tablet by mouth daily.   Omega-3 Fatty Acids (FISH OIL PO) Take 1,000 mg by mouth daily.   Red Yeast Rice 600 MG CAPS Take 600 capsules by mouth 2 (two) times a day.   sildenafil (REVATIO) 20 MG tablet Take 20 mg by mouth as directed.   TURMERIC PO Take 500 mg by mouth daily.    vitamin C (ASCORBIC ACID) 500 MG tablet Take 500 mg by mouth daily.   Vitamin D, Cholecalciferol, 1000 UNITS TABS Take 1,000 mg by mouth daily.     Allergies:   Celecoxib, Pravastatin sodium, Rosuvastatin calcium, and Sulfamethoxazole-trimethoprim   Past Medical History:  Diagnosis Date   Arthritis    Rheumatoid arthritis (HCC)    Sports hernia     Past Surgical History:  Procedure Laterality Date   SHOULDER SURGERY     right and left.     Social History:  The patient  reports that he has never smoked. He has never used smokeless tobacco. He reports current alcohol use of about 24.0 standard drinks of alcohol per week. He reports that he does not use drugs.   Family History:  The patient's family history includes Cancer in his father and mother; Healthy in his brother; Heart disease in his mother.    ROS:  Please see the history of present  illness. All other systems are reviewed and  Negative to the above problem except as noted.    PHYSICAL EXAM: VS:  BP 136/78 (BP Location: Right Arm, Patient Position: Sitting, Cuff Size: Normal)   Pulse 65   Ht 5\' 10"  (1.778 m)   Wt 183 lb (83 kg)   SpO2 99%   BMI 26.26 kg/m   GEN: Well nourished 72 yo in no acute distress  HEENT: normal  Neck: no JVD, no carotid bruit Cardiac: RRR; no murmur  No LE edema  Respiratory:  clear to auscultation  GI: soft, nontender   No hepatomegaly  MS: no deformity Moving all extremities     EKG:  EKG is ordered today.  SR 65 bpm  Incomplete RBBB Possible IWMI     Lipid Panel    Component Value Date/Time   CHOL 159 01/09/2019 0948   CHOL 166 12/10/2017 1052   TRIG 83 01/09/2019 0948   HDL 58 01/09/2019 0948   HDL 52 12/10/2017 1052   CHOLHDL 2.7 01/09/2019 0948   VLDL 17 01/09/2019 0948   LDLCALC 84 01/09/2019 0948   LDLCALC 101 (H) 12/10/2017 1052     Wt Readings from Last 3 Encounters:  05/24/23 183 lb (  83 kg)  04/08/21 185 lb (83.9 kg)  04/04/20 182 lb (82.6 kg)      ASSESSMENT AND PLAN:  1 CAD  Pt with check CT that showed significant coronary calcifications Pt remains aymptomatic Very active  Follow   2  Blood pressure   BP is a little high   Follow   Goal 110s/120s/  3  HL  Lipomed on 05/19/23:  LDL 92 with particle number 969; trig 80, HDL 76    Patient intolerant to statins   Would recomm REpatha but need to review with pharmacy given rheumatologic mes      Will need follow up labs  Otherwise F/U in 1 year      Current medicines are reviewed at length with the patient today.  The patient does not have concerns regarding medicines.  Signed, Leroy Pates, MD  05/24/2023 10:16 AM    [facility] [address] [place], [place], [place]  [address] Phone: [phone]; Fax: [phone]

## 2023-06-02 DIAGNOSIS — E782 Mixed hyperlipidemia: Secondary | ICD-10-CM | POA: Diagnosis not present

## 2023-06-02 DIAGNOSIS — M069 Rheumatoid arthritis, unspecified: Secondary | ICD-10-CM | POA: Diagnosis not present

## 2023-06-02 DIAGNOSIS — I2510 Atherosclerotic heart disease of native coronary artery without angina pectoris: Secondary | ICD-10-CM | POA: Diagnosis not present

## 2023-06-02 DIAGNOSIS — Z0000 Encounter for general adult medical examination without abnormal findings: Secondary | ICD-10-CM | POA: Diagnosis not present

## 2023-06-02 DIAGNOSIS — K219 Gastro-esophageal reflux disease without esophagitis: Secondary | ICD-10-CM | POA: Diagnosis not present

## 2023-06-02 DIAGNOSIS — Z1159 Encounter for screening for other viral diseases: Secondary | ICD-10-CM | POA: Diagnosis not present

## 2023-06-02 DIAGNOSIS — Z1331 Encounter for screening for depression: Secondary | ICD-10-CM | POA: Diagnosis not present

## 2023-06-02 DIAGNOSIS — Z125 Encounter for screening for malignant neoplasm of prostate: Secondary | ICD-10-CM | POA: Diagnosis not present

## 2023-06-02 DIAGNOSIS — I700 Atherosclerosis of aorta: Secondary | ICD-10-CM | POA: Diagnosis not present

## 2023-06-09 DIAGNOSIS — M0609 Rheumatoid arthritis without rheumatoid factor, multiple sites: Secondary | ICD-10-CM | POA: Diagnosis not present

## 2023-06-13 NOTE — Progress Notes (Signed)
Set pt up for REpatha Follow up lipomed and LFTs in 8 wks

## 2023-06-17 ENCOUNTER — Telehealth: Payer: Self-pay

## 2023-06-17 NOTE — Telephone Encounter (Signed)
-----   Message from Dietrich Pates sent at 06/13/2023 10:21 PM EDT -----    ----- Message ----- From: Olene Floss, RPH-CPP Sent: 06/07/2023   7:39 AM EDT To: Pricilla Riffle, MD  No issues with his other meds ----- Message ----- From: Pricilla Riffle, MD Sent: 06/06/2023  11:43 PM EDT To: Olene Floss, RPH-CPP  Melissa- ANy contraindication for Repatha with other meds?

## 2023-06-17 NOTE — Telephone Encounter (Signed)
I spoke with the pt re: using Repatha and he says that he is reluctant since he is already gets 2 injectables for his rheumatoid arthritis... he asked that I My Chart the name to him and give I'm time to think about it and do some research and if he decides to go forward he will call for the Pharm D referral... I also advised him that talking with the Pharm D is helpful and if not for him they can talk about other options for his lipid control and he agrees and says he will let me know.

## 2023-07-07 DIAGNOSIS — M0609 Rheumatoid arthritis without rheumatoid factor, multiple sites: Secondary | ICD-10-CM | POA: Diagnosis not present

## 2023-08-02 DIAGNOSIS — Z23 Encounter for immunization: Secondary | ICD-10-CM | POA: Diagnosis not present

## 2023-08-04 DIAGNOSIS — M0609 Rheumatoid arthritis without rheumatoid factor, multiple sites: Secondary | ICD-10-CM | POA: Diagnosis not present

## 2023-09-01 DIAGNOSIS — M0609 Rheumatoid arthritis without rheumatoid factor, multiple sites: Secondary | ICD-10-CM | POA: Diagnosis not present

## 2023-09-01 DIAGNOSIS — Z79899 Other long term (current) drug therapy: Secondary | ICD-10-CM | POA: Diagnosis not present

## 2023-09-01 DIAGNOSIS — Z6826 Body mass index (BMI) 26.0-26.9, adult: Secondary | ICD-10-CM | POA: Diagnosis not present

## 2023-09-01 DIAGNOSIS — M720 Palmar fascial fibromatosis [Dupuytren]: Secondary | ICD-10-CM | POA: Diagnosis not present

## 2023-09-01 DIAGNOSIS — E663 Overweight: Secondary | ICD-10-CM | POA: Diagnosis not present

## 2023-09-02 DIAGNOSIS — Z79899 Other long term (current) drug therapy: Secondary | ICD-10-CM | POA: Diagnosis not present

## 2023-09-02 DIAGNOSIS — M0609 Rheumatoid arthritis without rheumatoid factor, multiple sites: Secondary | ICD-10-CM | POA: Diagnosis not present

## 2023-09-15 ENCOUNTER — Ambulatory Visit
Admission: RE | Admit: 2023-09-15 | Discharge: 2023-09-15 | Disposition: A | Discharge status: Home / Self Care | Payer: Medicare Other | Source: Ambulatory Visit | Attending: Physician Assistant | Admitting: Physician Assistant

## 2023-09-15 ENCOUNTER — Other Ambulatory Visit: Payer: Self-pay | Admitting: Physician Assistant

## 2023-09-15 DIAGNOSIS — M79604 Pain in right leg: Secondary | ICD-10-CM | POA: Diagnosis not present

## 2023-09-15 DIAGNOSIS — M47816 Spondylosis without myelopathy or radiculopathy, lumbar region: Secondary | ICD-10-CM | POA: Diagnosis not present

## 2023-09-15 DIAGNOSIS — M4316 Spondylolisthesis, lumbar region: Secondary | ICD-10-CM | POA: Diagnosis not present

## 2023-10-07 DIAGNOSIS — M0609 Rheumatoid arthritis without rheumatoid factor, multiple sites: Secondary | ICD-10-CM | POA: Diagnosis not present

## 2023-11-10 DIAGNOSIS — M0609 Rheumatoid arthritis without rheumatoid factor, multiple sites: Secondary | ICD-10-CM | POA: Diagnosis not present

## 2023-12-08 DIAGNOSIS — M0609 Rheumatoid arthritis without rheumatoid factor, multiple sites: Secondary | ICD-10-CM | POA: Diagnosis not present

## 2023-12-29 DIAGNOSIS — D225 Melanocytic nevi of trunk: Secondary | ICD-10-CM | POA: Diagnosis not present

## 2023-12-29 DIAGNOSIS — L814 Other melanin hyperpigmentation: Secondary | ICD-10-CM | POA: Diagnosis not present

## 2023-12-29 DIAGNOSIS — L821 Other seborrheic keratosis: Secondary | ICD-10-CM | POA: Diagnosis not present

## 2023-12-29 DIAGNOSIS — D0422 Carcinoma in situ of skin of left ear and external auricular canal: Secondary | ICD-10-CM | POA: Diagnosis not present

## 2023-12-29 DIAGNOSIS — Z85828 Personal history of other malignant neoplasm of skin: Secondary | ICD-10-CM | POA: Diagnosis not present

## 2023-12-29 DIAGNOSIS — D2261 Melanocytic nevi of right upper limb, including shoulder: Secondary | ICD-10-CM | POA: Diagnosis not present

## 2023-12-29 DIAGNOSIS — D1801 Hemangioma of skin and subcutaneous tissue: Secondary | ICD-10-CM | POA: Diagnosis not present

## 2023-12-29 DIAGNOSIS — L570 Actinic keratosis: Secondary | ICD-10-CM | POA: Diagnosis not present

## 2023-12-29 DIAGNOSIS — D692 Other nonthrombocytopenic purpura: Secondary | ICD-10-CM | POA: Diagnosis not present

## 2024-01-06 DIAGNOSIS — M0609 Rheumatoid arthritis without rheumatoid factor, multiple sites: Secondary | ICD-10-CM | POA: Diagnosis not present

## 2024-01-11 DIAGNOSIS — I700 Atherosclerosis of aorta: Secondary | ICD-10-CM | POA: Diagnosis not present

## 2024-01-11 DIAGNOSIS — I2510 Atherosclerotic heart disease of native coronary artery without angina pectoris: Secondary | ICD-10-CM | POA: Diagnosis not present

## 2024-01-31 DIAGNOSIS — I2510 Atherosclerotic heart disease of native coronary artery without angina pectoris: Secondary | ICD-10-CM | POA: Diagnosis not present

## 2024-01-31 DIAGNOSIS — M069 Rheumatoid arthritis, unspecified: Secondary | ICD-10-CM | POA: Diagnosis not present

## 2024-01-31 DIAGNOSIS — I700 Atherosclerosis of aorta: Secondary | ICD-10-CM | POA: Diagnosis not present

## 2024-01-31 DIAGNOSIS — E782 Mixed hyperlipidemia: Secondary | ICD-10-CM | POA: Diagnosis not present

## 2024-02-07 DIAGNOSIS — Z111 Encounter for screening for respiratory tuberculosis: Secondary | ICD-10-CM | POA: Diagnosis not present

## 2024-02-07 DIAGNOSIS — Z79899 Other long term (current) drug therapy: Secondary | ICD-10-CM | POA: Diagnosis not present

## 2024-02-07 DIAGNOSIS — R5383 Other fatigue: Secondary | ICD-10-CM | POA: Diagnosis not present

## 2024-02-07 DIAGNOSIS — M0609 Rheumatoid arthritis without rheumatoid factor, multiple sites: Secondary | ICD-10-CM | POA: Diagnosis not present

## 2024-02-16 DIAGNOSIS — I2510 Atherosclerotic heart disease of native coronary artery without angina pectoris: Secondary | ICD-10-CM | POA: Diagnosis not present

## 2024-02-16 DIAGNOSIS — I700 Atherosclerosis of aorta: Secondary | ICD-10-CM | POA: Diagnosis not present

## 2024-02-21 DIAGNOSIS — Z79899 Other long term (current) drug therapy: Secondary | ICD-10-CM | POA: Diagnosis not present

## 2024-02-21 DIAGNOSIS — M0609 Rheumatoid arthritis without rheumatoid factor, multiple sites: Secondary | ICD-10-CM | POA: Diagnosis not present

## 2024-02-21 DIAGNOSIS — E663 Overweight: Secondary | ICD-10-CM | POA: Diagnosis not present

## 2024-02-21 DIAGNOSIS — Z6826 Body mass index (BMI) 26.0-26.9, adult: Secondary | ICD-10-CM | POA: Diagnosis not present

## 2024-03-01 DIAGNOSIS — I2510 Atherosclerotic heart disease of native coronary artery without angina pectoris: Secondary | ICD-10-CM | POA: Diagnosis not present

## 2024-03-01 DIAGNOSIS — M069 Rheumatoid arthritis, unspecified: Secondary | ICD-10-CM | POA: Diagnosis not present

## 2024-03-01 DIAGNOSIS — I700 Atherosclerosis of aorta: Secondary | ICD-10-CM | POA: Diagnosis not present

## 2024-03-01 DIAGNOSIS — E782 Mixed hyperlipidemia: Secondary | ICD-10-CM | POA: Diagnosis not present

## 2024-03-15 DIAGNOSIS — M0609 Rheumatoid arthritis without rheumatoid factor, multiple sites: Secondary | ICD-10-CM | POA: Diagnosis not present

## 2024-03-20 DIAGNOSIS — I700 Atherosclerosis of aorta: Secondary | ICD-10-CM | POA: Diagnosis not present

## 2024-03-20 DIAGNOSIS — I2510 Atherosclerotic heart disease of native coronary artery without angina pectoris: Secondary | ICD-10-CM | POA: Diagnosis not present

## 2024-03-24 DIAGNOSIS — E782 Mixed hyperlipidemia: Secondary | ICD-10-CM | POA: Diagnosis not present

## 2024-03-24 DIAGNOSIS — I10 Essential (primary) hypertension: Secondary | ICD-10-CM | POA: Diagnosis not present

## 2024-04-01 DIAGNOSIS — M069 Rheumatoid arthritis, unspecified: Secondary | ICD-10-CM | POA: Diagnosis not present

## 2024-04-01 DIAGNOSIS — E782 Mixed hyperlipidemia: Secondary | ICD-10-CM | POA: Diagnosis not present

## 2024-04-01 DIAGNOSIS — I700 Atherosclerosis of aorta: Secondary | ICD-10-CM | POA: Diagnosis not present

## 2024-04-01 DIAGNOSIS — I2510 Atherosclerotic heart disease of native coronary artery without angina pectoris: Secondary | ICD-10-CM | POA: Diagnosis not present

## 2024-04-12 DIAGNOSIS — M0609 Rheumatoid arthritis without rheumatoid factor, multiple sites: Secondary | ICD-10-CM | POA: Diagnosis not present

## 2024-04-17 DIAGNOSIS — I10 Essential (primary) hypertension: Secondary | ICD-10-CM | POA: Diagnosis not present

## 2024-04-17 DIAGNOSIS — E782 Mixed hyperlipidemia: Secondary | ICD-10-CM | POA: Diagnosis not present

## 2024-04-19 DIAGNOSIS — I700 Atherosclerosis of aorta: Secondary | ICD-10-CM | POA: Diagnosis not present

## 2024-04-19 DIAGNOSIS — I2510 Atherosclerotic heart disease of native coronary artery without angina pectoris: Secondary | ICD-10-CM | POA: Diagnosis not present

## 2024-04-20 NOTE — Progress Notes (Signed)
 Cardiology Office Note   Date:  04/21/2024   ID:  KIREE DEJARNETTE, DOB 09/19/1951, MRN [id number]  PCP:  [facility]  Cardiologist:   Vina Gull, MD   F/u of CAD and blood pressure     History of Present Illness: Leroy Hines is a 73 y.o. male with a history of HTNmcoronary calcifications, CP in past   Stress test in 2016   Pt has declined statin    I saw the pt in Summer 2024  LDL in May  2024 was 81 with 833 particles   Pt on Red yeast rice    I recomm Zetia  as well along with diet   He did not do this   Since seen he denies CP  Breathing is good   He is active   Plays golf 3x per week  Mows lawn with push mower  Recently started on losartan 25 mg for BP   It has improved   has follow up with PCP in August  LIpids from PCP in Jun 2025 LDLD 84  HDL 67      Current Meds  Medication Sig   CALCIUM-MAGNESIUM-ZINC PO Take by mouth daily.   Certolizumab Pegol  (CIMZIA  [place]) Inject into the skin every 30 (thirty) days.   Coenzyme Q10 (CO Q10 PO) Take by mouth.   losartan (COZAAR) 25 MG tablet Take 25 mg by mouth daily.   Omega-3 Fatty Acids (FISH OIL PO) Take 1,000 mg by mouth daily.   Red Yeast Rice 600 MG CAPS Take 600 capsules by mouth 2 (two) times a day.   sildenafil (REVATIO) 20 MG tablet Take 20 mg by mouth as directed.   TURMERIC PO Take 500 mg by mouth daily.    vitamin C (ASCORBIC ACID) 500 MG tablet Take 500 mg by mouth daily.   Vitamin D, Cholecalciferol, 1000 UNITS TABS Take 1,000 mg by mouth daily.     Allergies:   Celecoxib, Pravastatin  sodium, Rosuvastatin calcium, and Sulfamethoxazole-trimethoprim   Past Medical History:  Diagnosis Date   Arthritis    Rheumatoid arthritis (HCC)    Sports hernia     Past Surgical History:  Procedure Laterality Date   SHOULDER SURGERY     right and left.     Social History:  The patient  reports that he has never smoked. He has never used smokeless tobacco. He reports current alcohol use of  about 24.0 standard drinks of alcohol per week. He reports that he does not use drugs.   Family History:  The patient's family history includes Cancer in his father and mother; Healthy in his brother; Heart disease in his mother.    ROS:  Please see the history of present illness. All other systems are reviewed and  Negative to the above problem except as noted.    PHYSICAL EXAM: VS:  BP 131/74 (BP Location: Left Arm, Cuff Size: Normal)   Pulse 72   Ht 5' 10 (1.778 m)   Wt 184 lb (83.5 kg)   SpO2 97%   BMI 26.40 kg/m   GEN: Well nourished 73 yo in no acute distress  HEENT: normal  Neck: no JVD, no carotid bruits Cardiac: RRR; no murmurs  No LE edema  Respiratory:  clear to auscultation  GI: soft, nontender   No hepatomegaly No masses  EKG:  EKG is ordered today.  NSR  First degree AV block   Incomp RBBB   Lipid Panel    Component  Value Date/Time   CHOL 159 01/09/2019 0948   CHOL 166 12/10/2017 1052   TRIG 83 01/09/2019 0948   HDL 58 01/09/2019 0948   HDL 52 12/10/2017 1052   CHOLHDL 2.7 01/09/2019 0948   VLDL 17 01/09/2019 0948   LDLCALC 84 01/09/2019 0948   LDLCALC 101 (H) 12/10/2017 1052     Wt Readings from Last 3 Encounters:  04/21/24 184 lb (83.5 kg)  05/24/23 183 lb (83 kg)  04/08/21 185 lb (83.9 kg)      ASSESSMENT AND PLAN:  1 CAD  Pt with chest  CT that showed significant coronary calcifications Pt remains aymptomatic Very active  Follow   2  Blood pressure BP is improved on current regimen   Follow   3  HL  Lipomed on 05/19/23:  LDL 92 with particle number 969; trig 80, HDL 76    Last lipids in April 17, 2024 LDL 84  HDL K67  Trig 163 Patient intolerant to statins but has been on red yeast rice    He is reluctant to use Repatha     Would recomm Zetia  10 mg    Follow up lipomed and liver panel in 8 wks     Otherwise F/U in 1 year      Current medicines are reviewed at length with the patient today.  The patient does not have concerns regarding  medicines.  Signed, Vina Gull, MD  04/21/2024 9:46 AM    [facility] [address] [place], [place], [place]  [address] Phone: [phone]; Fax: [phone]

## 2024-04-21 ENCOUNTER — Encounter: Payer: Self-pay | Admitting: Internal Medicine

## 2024-04-21 ENCOUNTER — Ambulatory Visit: Attending: Internal Medicine | Admitting: Internal Medicine

## 2024-04-21 VITALS — BP 131/74 | HR 72 | Ht 70.0 in | Wt 184.0 lb

## 2024-04-21 DIAGNOSIS — I2510 Atherosclerotic heart disease of native coronary artery without angina pectoris: Secondary | ICD-10-CM | POA: Diagnosis not present

## 2024-04-21 DIAGNOSIS — E782 Mixed hyperlipidemia: Secondary | ICD-10-CM | POA: Diagnosis not present

## 2024-04-21 MED ORDER — EZETIMIBE 10 MG PO TABS: 10.0000 mg | ORAL_TABLET | Freq: Every day | ORAL | 3 refills | Status: AC

## 2024-04-21 NOTE — Addendum Note (Signed)
 Addended by: Kylar Leonhardt G on: 04/21/2024 10:13 AM   Modules accepted: Orders

## 2024-04-21 NOTE — Patient Instructions (Signed)
 Medication Instructions:  Your physician has recommended you make the following change in your medication:   Start Zetia  10 mg Daily   *If you need a refill on your cardiac medications before your next appointment, please call your pharmacy*  Lab Work: Your physician recommends that you return for lab work in: 8 Weeks ( NMR, Liver)   If you have labs (blood work) drawn today and your tests are completely normal, you will receive your results only by: MyChart Message (if you have MyChart) OR A paper copy in the mail If you have any lab test that is abnormal or we need to change your treatment, we will call you to review the results.  Testing/Procedures: NONE   Follow-Up: At [facility], you and your health needs are our priority.  As part of our continuing mission to provide you with exceptional heart care, our providers are all part of one team.  This team includes your primary Cardiologist (physician) and Advanced Practice Providers or APPs (Physician Assistants and Nurse Practitioners) who all work together to provide you with the care you need, when you need it.  Your next appointment:   1 year(s)  Provider:   You may see Ola Berger, MD or one of the following Advanced Practice Providers on your designated Care Team:   Woodfin Hays, PA-C  [place], [place] Theotis Flake, [place]     We recommend signing up for the patient portal called MyChart.  Sign up information is provided on this After Visit Summary.  MyChart is used to connect with patients for Virtual Visits (Telemedicine).  Patients are able to view lab/test results, encounter notes, upcoming appointments, etc.  Non-urgent messages can be sent to your provider as well.   To learn more about what you can do with MyChart, go to [email or web address].   Other Instructions Thank you for choosing [facility]!

## 2024-05-01 DIAGNOSIS — I2510 Atherosclerotic heart disease of native coronary artery without angina pectoris: Secondary | ICD-10-CM | POA: Diagnosis not present

## 2024-05-01 DIAGNOSIS — M069 Rheumatoid arthritis, unspecified: Secondary | ICD-10-CM | POA: Diagnosis not present

## 2024-05-01 DIAGNOSIS — E782 Mixed hyperlipidemia: Secondary | ICD-10-CM | POA: Diagnosis not present

## 2024-05-10 DIAGNOSIS — M0609 Rheumatoid arthritis without rheumatoid factor, multiple sites: Secondary | ICD-10-CM | POA: Diagnosis not present

## 2024-06-01 DIAGNOSIS — I2510 Atherosclerotic heart disease of native coronary artery without angina pectoris: Secondary | ICD-10-CM | POA: Diagnosis not present

## 2024-06-01 DIAGNOSIS — I700 Atherosclerosis of aorta: Secondary | ICD-10-CM | POA: Diagnosis not present

## 2024-06-01 DIAGNOSIS — M069 Rheumatoid arthritis, unspecified: Secondary | ICD-10-CM | POA: Diagnosis not present

## 2024-06-01 DIAGNOSIS — E782 Mixed hyperlipidemia: Secondary | ICD-10-CM | POA: Diagnosis not present

## 2024-06-07 DIAGNOSIS — G720 Drug-induced myopathy: Secondary | ICD-10-CM | POA: Diagnosis not present

## 2024-06-07 DIAGNOSIS — Z125 Encounter for screening for malignant neoplasm of prostate: Secondary | ICD-10-CM | POA: Diagnosis not present

## 2024-06-07 DIAGNOSIS — M069 Rheumatoid arthritis, unspecified: Secondary | ICD-10-CM | POA: Diagnosis not present

## 2024-06-07 DIAGNOSIS — Z0000 Encounter for general adult medical examination without abnormal findings: Secondary | ICD-10-CM | POA: Diagnosis not present

## 2024-06-07 DIAGNOSIS — I2510 Atherosclerotic heart disease of native coronary artery without angina pectoris: Secondary | ICD-10-CM | POA: Diagnosis not present

## 2024-06-07 DIAGNOSIS — M0609 Rheumatoid arthritis without rheumatoid factor, multiple sites: Secondary | ICD-10-CM | POA: Diagnosis not present

## 2024-06-07 DIAGNOSIS — Z1331 Encounter for screening for depression: Secondary | ICD-10-CM | POA: Diagnosis not present

## 2024-06-07 DIAGNOSIS — Z23 Encounter for immunization: Secondary | ICD-10-CM | POA: Diagnosis not present

## 2024-06-07 DIAGNOSIS — I10 Essential (primary) hypertension: Secondary | ICD-10-CM | POA: Diagnosis not present

## 2024-06-07 DIAGNOSIS — E782 Mixed hyperlipidemia: Secondary | ICD-10-CM | POA: Diagnosis not present

## 2024-06-07 DIAGNOSIS — I700 Atherosclerosis of aorta: Secondary | ICD-10-CM | POA: Diagnosis not present

## 2024-06-10 DIAGNOSIS — I2510 Atherosclerotic heart disease of native coronary artery without angina pectoris: Secondary | ICD-10-CM | POA: Diagnosis not present

## 2024-06-10 DIAGNOSIS — I700 Atherosclerosis of aorta: Secondary | ICD-10-CM | POA: Diagnosis not present

## 2024-06-12 ENCOUNTER — Ambulatory Visit: Payer: Self-pay | Admitting: Internal Medicine

## 2024-07-02 DIAGNOSIS — M069 Rheumatoid arthritis, unspecified: Secondary | ICD-10-CM | POA: Diagnosis not present

## 2024-07-02 DIAGNOSIS — I700 Atherosclerosis of aorta: Secondary | ICD-10-CM | POA: Diagnosis not present

## 2024-07-02 DIAGNOSIS — I2510 Atherosclerotic heart disease of native coronary artery without angina pectoris: Secondary | ICD-10-CM | POA: Diagnosis not present

## 2024-07-02 DIAGNOSIS — E782 Mixed hyperlipidemia: Secondary | ICD-10-CM | POA: Diagnosis not present

## 2024-07-05 DIAGNOSIS — M0609 Rheumatoid arthritis without rheumatoid factor, multiple sites: Secondary | ICD-10-CM | POA: Diagnosis not present

## 2024-07-10 DIAGNOSIS — I700 Atherosclerosis of aorta: Secondary | ICD-10-CM | POA: Diagnosis not present

## 2024-07-10 DIAGNOSIS — I2510 Atherosclerotic heart disease of native coronary artery without angina pectoris: Secondary | ICD-10-CM | POA: Diagnosis not present

## 2024-08-01 DIAGNOSIS — I2510 Atherosclerotic heart disease of native coronary artery without angina pectoris: Secondary | ICD-10-CM | POA: Diagnosis not present

## 2024-08-01 DIAGNOSIS — I700 Atherosclerosis of aorta: Secondary | ICD-10-CM | POA: Diagnosis not present

## 2024-08-01 DIAGNOSIS — M069 Rheumatoid arthritis, unspecified: Secondary | ICD-10-CM | POA: Diagnosis not present

## 2024-08-01 DIAGNOSIS — E782 Mixed hyperlipidemia: Secondary | ICD-10-CM | POA: Diagnosis not present

## 2024-08-02 DIAGNOSIS — Z79899 Other long term (current) drug therapy: Secondary | ICD-10-CM | POA: Diagnosis not present

## 2024-08-02 DIAGNOSIS — M0609 Rheumatoid arthritis without rheumatoid factor, multiple sites: Secondary | ICD-10-CM | POA: Diagnosis not present

## 2024-08-02 DIAGNOSIS — E785 Hyperlipidemia, unspecified: Secondary | ICD-10-CM | POA: Diagnosis not present

## 2024-08-09 ENCOUNTER — Other Ambulatory Visit (HOSPITAL_COMMUNITY): Payer: Self-pay | Admitting: Internal Medicine

## 2024-08-09 DIAGNOSIS — R17 Unspecified jaundice: Secondary | ICD-10-CM | POA: Diagnosis not present

## 2024-08-09 DIAGNOSIS — R1031 Right lower quadrant pain: Secondary | ICD-10-CM | POA: Diagnosis not present

## 2024-08-09 DIAGNOSIS — I700 Atherosclerosis of aorta: Secondary | ICD-10-CM | POA: Diagnosis not present

## 2024-08-09 DIAGNOSIS — K429 Umbilical hernia without obstruction or gangrene: Secondary | ICD-10-CM | POA: Diagnosis not present

## 2024-08-09 DIAGNOSIS — I2510 Atherosclerotic heart disease of native coronary artery without angina pectoris: Secondary | ICD-10-CM | POA: Diagnosis not present

## 2024-08-09 DIAGNOSIS — Z23 Encounter for immunization: Secondary | ICD-10-CM | POA: Diagnosis not present

## 2024-08-11 ENCOUNTER — Ambulatory Visit (HOSPITAL_COMMUNITY)
Admission: RE | Admit: 2024-08-11 | Discharge: 2024-08-11 | Disposition: A | Discharge status: Home / Self Care | Source: Ambulatory Visit | Attending: Internal Medicine | Admitting: Internal Medicine

## 2024-08-11 DIAGNOSIS — R1031 Right lower quadrant pain: Secondary | ICD-10-CM | POA: Diagnosis present

## 2024-08-11 MED ORDER — IOHEXOL 300 MG/ML  SOLN
100.0000 mL | Freq: Once | INTRAMUSCULAR | Status: AC | PRN
  Administered 2024-08-11: 100 mL via INTRAVENOUS

## 2024-08-23 DIAGNOSIS — Z79899 Other long term (current) drug therapy: Secondary | ICD-10-CM | POA: Diagnosis not present

## 2024-08-23 DIAGNOSIS — E663 Overweight: Secondary | ICD-10-CM | POA: Diagnosis not present

## 2024-08-23 DIAGNOSIS — M0609 Rheumatoid arthritis without rheumatoid factor, multiple sites: Secondary | ICD-10-CM | POA: Diagnosis not present

## 2024-08-23 DIAGNOSIS — M720 Palmar fascial fibromatosis [Dupuytren]: Secondary | ICD-10-CM | POA: Diagnosis not present

## 2024-08-23 DIAGNOSIS — Z6826 Body mass index (BMI) 26.0-26.9, adult: Secondary | ICD-10-CM | POA: Diagnosis not present

## 2024-08-30 DIAGNOSIS — M0609 Rheumatoid arthritis without rheumatoid factor, multiple sites: Secondary | ICD-10-CM | POA: Diagnosis not present

## 2024-09-01 DIAGNOSIS — E782 Mixed hyperlipidemia: Secondary | ICD-10-CM | POA: Diagnosis not present

## 2024-09-01 DIAGNOSIS — I2510 Atherosclerotic heart disease of native coronary artery without angina pectoris: Secondary | ICD-10-CM | POA: Diagnosis not present

## 2024-09-01 DIAGNOSIS — M069 Rheumatoid arthritis, unspecified: Secondary | ICD-10-CM | POA: Diagnosis not present

## 2024-09-01 DIAGNOSIS — I700 Atherosclerosis of aorta: Secondary | ICD-10-CM | POA: Diagnosis not present

## 2024-09-06 DIAGNOSIS — E782 Mixed hyperlipidemia: Secondary | ICD-10-CM | POA: Diagnosis not present

## 2024-09-06 DIAGNOSIS — I2510 Atherosclerotic heart disease of native coronary artery without angina pectoris: Secondary | ICD-10-CM | POA: Diagnosis not present

## 2024-09-06 DIAGNOSIS — I10 Essential (primary) hypertension: Secondary | ICD-10-CM | POA: Diagnosis not present

## 2024-09-06 DIAGNOSIS — G720 Drug-induced myopathy: Secondary | ICD-10-CM | POA: Diagnosis not present

## 2024-09-06 DIAGNOSIS — M720 Palmar fascial fibromatosis [Dupuytren]: Secondary | ICD-10-CM | POA: Diagnosis not present

## 2024-09-06 DIAGNOSIS — K4090 Unilateral inguinal hernia, without obstruction or gangrene, not specified as recurrent: Secondary | ICD-10-CM | POA: Diagnosis not present

## 2024-09-06 DIAGNOSIS — M069 Rheumatoid arthritis, unspecified: Secondary | ICD-10-CM | POA: Diagnosis not present

## 2024-09-08 DIAGNOSIS — M720 Palmar fascial fibromatosis [Dupuytren]: Secondary | ICD-10-CM | POA: Diagnosis not present

## 2024-10-01 DIAGNOSIS — I2510 Atherosclerotic heart disease of native coronary artery without angina pectoris: Secondary | ICD-10-CM | POA: Diagnosis not present

## 2024-10-01 DIAGNOSIS — M069 Rheumatoid arthritis, unspecified: Secondary | ICD-10-CM | POA: Diagnosis not present

## 2024-10-01 DIAGNOSIS — E782 Mixed hyperlipidemia: Secondary | ICD-10-CM | POA: Diagnosis not present

## 2024-10-02 DIAGNOSIS — L089 Local infection of the skin and subcutaneous tissue, unspecified: Secondary | ICD-10-CM | POA: Diagnosis not present

## 2024-10-02 DIAGNOSIS — Z23 Encounter for immunization: Secondary | ICD-10-CM | POA: Diagnosis not present

## 2024-10-02 DIAGNOSIS — S8992XA Unspecified injury of left lower leg, initial encounter: Secondary | ICD-10-CM | POA: Diagnosis not present

## 2024-10-05 DIAGNOSIS — M0609 Rheumatoid arthritis without rheumatoid factor, multiple sites: Secondary | ICD-10-CM | POA: Diagnosis not present
